# Patient Record
Sex: Female | Born: 2000 | Race: White | Hispanic: No | Marital: Single | Smoking: Never smoker
Health system: Southern US, Community
[De-identification: ages and names within clinical notes are randomized; demographics above are authoritative.]

## PROBLEM LIST (undated history)

## (undated) DIAGNOSIS — F419 Anxiety disorder, unspecified: Secondary | ICD-10-CM

## (undated) DIAGNOSIS — I951 Orthostatic hypotension: Secondary | ICD-10-CM

## (undated) DIAGNOSIS — E282 Polycystic ovarian syndrome: Secondary | ICD-10-CM

## (undated) DIAGNOSIS — M797 Fibromyalgia: Secondary | ICD-10-CM

## (undated) DIAGNOSIS — F32A Depression, unspecified: Secondary | ICD-10-CM

## (undated) DIAGNOSIS — F411 Generalized anxiety disorder: Secondary | ICD-10-CM

## (undated) DIAGNOSIS — F319 Bipolar disorder, unspecified: Secondary | ICD-10-CM

## (undated) DIAGNOSIS — R002 Palpitations: Secondary | ICD-10-CM

## (undated) DIAGNOSIS — G43009 Migraine without aura, not intractable, without status migrainosus: Secondary | ICD-10-CM

## (undated) DIAGNOSIS — H9190 Unspecified hearing loss, unspecified ear: Secondary | ICD-10-CM

## (undated) DIAGNOSIS — R000 Tachycardia, unspecified: Secondary | ICD-10-CM

## (undated) DIAGNOSIS — G44209 Tension-type headache, unspecified, not intractable: Secondary | ICD-10-CM

## (undated) DIAGNOSIS — R569 Unspecified convulsions: Secondary | ICD-10-CM

## (undated) DIAGNOSIS — H5500 Unspecified nystagmus: Secondary | ICD-10-CM

## (undated) DIAGNOSIS — N912 Amenorrhea, unspecified: Secondary | ICD-10-CM

## (undated) DIAGNOSIS — G4700 Insomnia, unspecified: Secondary | ICD-10-CM

## (undated) DIAGNOSIS — F410 Panic disorder [episodic paroxysmal anxiety] without agoraphobia: Secondary | ICD-10-CM

## (undated) DIAGNOSIS — R55 Syncope and collapse: Secondary | ICD-10-CM

## (undated) DIAGNOSIS — R51 Headache: Secondary | ICD-10-CM

## (undated) DIAGNOSIS — F329 Major depressive disorder, single episode, unspecified: Secondary | ICD-10-CM

## (undated) HISTORY — DX: Unspecified nystagmus: H55.00

## (undated) HISTORY — DX: Tension-type headache, unspecified, not intractable: G44.209

## (undated) HISTORY — DX: Syncope and collapse: R55

## (undated) HISTORY — DX: Migraine without aura, not intractable, without status migrainosus: G43.009

## (undated) HISTORY — DX: Insomnia, unspecified: G47.00

## (undated) HISTORY — DX: Palpitations: R00.2

## (undated) HISTORY — DX: Amenorrhea, unspecified: N91.2

## (undated) HISTORY — DX: Tachycardia, unspecified: R00.0

## (undated) HISTORY — DX: Generalized anxiety disorder: F41.1

## (undated) HISTORY — DX: Orthostatic hypotension: I95.1

## (undated) HISTORY — DX: Headache: R51

## (undated) HISTORY — DX: Unspecified convulsions: R56.9

## (undated) HISTORY — DX: Panic disorder (episodic paroxysmal anxiety): F41.0

## (undated) HISTORY — PX: NO PAST SURGERIES: SHX2092

---
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
## 2018-01-04 ENCOUNTER — Other Ambulatory Visit: Payer: Self-pay

## 2018-01-04 ENCOUNTER — Encounter: Payer: Self-pay | Admitting: Emergency Medicine

## 2018-01-04 ENCOUNTER — Emergency Department
Admission: EM | Admit: 2018-01-04 | Discharge: 2018-01-04 | Disposition: A | Discharge status: Home / Self Care | Payer: Medicaid Other | Attending: Emergency Medicine | Admitting: Emergency Medicine

## 2018-01-04 DIAGNOSIS — Z79899 Other long term (current) drug therapy: Secondary | ICD-10-CM | POA: Insufficient documentation

## 2018-01-04 DIAGNOSIS — J111 Influenza due to unidentified influenza virus with other respiratory manifestations: Secondary | ICD-10-CM | POA: Insufficient documentation

## 2018-01-04 DIAGNOSIS — R69 Illness, unspecified: Secondary | ICD-10-CM

## 2018-01-04 MED ORDER — HYDROCOD POLST-CPM POLST ER 10-8 MG/5ML PO SUER
5.0000 mL | Freq: Once | ORAL | Status: AC
  Administered 2018-01-04: 5 mL via ORAL
  Filled 2018-01-04: qty 5

## 2018-01-04 MED ORDER — IBUPROFEN 600 MG PO TABS
600.0000 mg | ORAL_TABLET | Freq: Once | ORAL | Status: AC
  Administered 2018-01-04: 600 mg via ORAL
  Filled 2018-01-04: qty 1

## 2018-01-04 MED ORDER — PSEUDOEPH-BROMPHEN-DM 30-2-10 MG/5ML PO SYRP: 10.0000 mL | ORAL_SOLUTION | Freq: Four times a day (QID) | ORAL | 0 refills | Status: DC | PRN

## 2018-01-04 NOTE — ED Triage Notes (Signed)
Patient ambulatory to triage with steady gait, without difficulty or distress noted, mask in place; pt reports body aches, fever cough & congestion today

## 2018-01-04 NOTE — Discharge Instructions (Signed)
Take tylenol or ibuprofen for fever and body aches.  Follow up with your primary care provider for symptoms that are not improving over the week.  Return to the ER for symptoms that change or worsen if unable to schedule an appointment.

## 2018-01-04 NOTE — ED Provider Notes (Signed)
[facility] Emergency Department Provider Note  ____________________________________________  Time seen: Approximately 10:07 PM  I have reviewed the triage vital signs and the nursing notes.   HISTORY  Chief Complaint Cough   HPI Gina Green is a 17 y.o. female who presents to the emergency department for treatment and evaluation of body aches, cough, fever, congestion, sore throat, and ear ache that started today. No alleviating measures attempted prior to arrival. No chronic illness.    Past Medical History:  Diagnosis Date  . Amenorrhea   . Anxiety   . Anxiety state 05/31/2015  . Depression   . Fibromyalgia   . Generalized anxiety disorder 05/15/2014  . Hearing loss   . Insomnia 05/15/2014  . Migraine without aura and without status migrainosus, not intractable 05/15/2014  . Moderate headache 05/31/2015  . Nystagmus   . Palpitations   . Panic attack 05/15/2014  . POTS (postural orthostatic tachycardia syndrome) 10/19/2016  . Pre-syncope   . Seizure-like activity (HCC) 05/31/2015  . Tension headache 05/15/2014    Patient Active Problem List   Diagnosis Date Noted  . Multiple neurological symptoms 02/05/2017  . 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 Cannabis abuse 02/05/2017  . POTS (postural orthostatic tachycardia syndrome) 10/19/2016  . Palpitations 07/31/2016  . Moderate headache 05/31/2015  . Seizure-like activity (HCC) 05/31/2015  . Anxiety state 05/31/2015  . Tension headache 05/15/2014  . Generalized anxiety disorder 05/15/2014  . Panic attack 05/15/2014  . Insomnia 05/15/2014  . Migraine without aura and without status migrainosus, not intractable 05/15/2014    Past Surgical History:  Procedure Laterality Date  . NO PAST SURGERIES      Prior to Admission medications   Medication Sig Start Date End Date Taking? Authorizing Provider  ALPRAZolam (XANAX) 0.25 MG tablet Take 0.25 mg by mouth 2 (two) times daily.    [provider]   amoxicillin (AMOXIL) 875 MG tablet Take 1 tablet (875 mg total) by mouth 2 (two) times daily. 03/25/17   Sherrie MustacheFisher, Roselyn BeringSusan W, PA-C  brompheniramine-pseudoephedrine-DM 30-2-10 MG/5ML syrup Take 10 mLs by mouth 4 (four) times daily as needed. 01/04/18   Ramey Ketcherside B, FNP  busPIRone (BUSPAR) 7.5 MG tablet Take 7.5 mg by mouth 2 (two) times daily.     [provider]  DULoxetine (CYMBALTA) 30 MG capsule Take 30 mg by mouth daily.    [provider]  topiramate (TOPAMAX) 25 MG tablet Take 1 tablet (25 mg total) by mouth 2 (two) times daily. 02/05/17   Anson FretAhern, Antonia B, MD  traZODone (DESYREL) 50 MG tablet Take 50 mg by mouth daily.     [provider]    Allergies Hpv 9-valent recomb vaccine and Other  Family History  Problem Relation Age of Onset  . High Cholesterol Mother   . Thyroid disease Mother   . Depression Mother   . Anxiety disorder Mother   . Migraines Mother   . Anxiety disorder Father   . High Cholesterol Maternal Grandmother   . Thyroid disease Maternal Grandmother   . Lung cancer Maternal Grandmother   . Cancer Maternal Grandfather        MOUTH  . Cirrhosis Maternal Grandfather   . Cancer Paternal Grandmother        SKIN  . Heart disease Other        maternal great grandparents, both had MI & strokes    Social History Social History   Tobacco Use  . Smoking status: Never Smoker  . Smokeless  tobacco: Never Used  Substance Use Topics  . Alcohol use: No    Alcohol/week: 0.0 standard drinks  . Drug use: Yes    Types: Marijuana    Comment: pt denies even past use of drugs    Review of Systems Constitutional: Positive for fever/chills. Normal appetite. ENT: Positive for sore throat. Cardiovascular: Denies chest pain. Respiratory: Negative for shortness of breath. Positive for cough. Negative for wheezing.  Gastrointestinal: Negative for nausea,  no vomiting.  no diarrhea.  Musculoskeletal: Positive for body aches Skin: Negative for  rash. Neurological: positive for headaches ____________________________________________   PHYSICAL EXAM:  VITAL SIGNS: ED Triage Vitals [01/04/18 2121]  Enc Vitals Group     BP 124/67     Pulse Rate 89     Resp 18     Temp 98.9 F (37.2 C)     Temp Source Oral     SpO2 99 %     Weight 135 lb (61.2 kg)     Height 5\' 2"  (1.575 m)     Head Circumference      Peak Flow      Pain Score 6     Pain Loc      Pain Edu?      Excl. in GC?     Constitutional: Alert and oriented. Acutely ill appearing and in no acute distress. Eyes: Conjunctivae are normal. Ears: Bilateral TM injected and erythematous Nose: maxillary sinus congestion noted; no rhinnorhea. Mouth/Throat: Mucous membranes are moist.  Oropharynx erythematous. Tonsils not visualized. Uvula midline. Neck: No stridor.  Lymphatic: Palpable, tender anterior cervical lymphadenopathy. Cardiovascular: Normal rate, regular rhythm. Good peripheral circulation. Respiratory: Respirations are even and unlabored.  No retractions. Breath sounds clear to auscultation. Gastrointestinal: Soft and nontender.  Musculoskeletal: FROM x 4 extremities.  Neurologic:  Normal speech and language. Skin:  Skin is warm, dry and intact. No rash noted. Psychiatric: Mood and affect are normal. Speech and behavior are normal.  ____________________________________________   LABS (all labs ordered are listed, but only abnormal results are displayed)  Labs Reviewed - No data to display ____________________________________________  EKG  Not indicated. ____________________________________________  RADIOLOGY  Not indicated. ____________________________________________   PROCEDURES  Procedure(s) performed: None  Critical Care performed: No ____________________________________________   INITIAL IMPRESSION / ASSESSMENT AND PLAN / ED COURSE  17 y.o. female who presents to the emergency department for treatment and evaluation of symptoms  and exam consistent with influenza. She will be treated symptomatically with Bromfed DM, tylenol and ibuprofen. She was advised to follow up with her PCP for symptoms that are not improving over the next few days. She is to return to the ER for symptoms that change or worsen if unable to schedule an appointment.    Medications  chlorpheniramine-HYDROcodone (TUSSIONEX) 10-8 MG/5ML suspension 5 mL (5 mLs Oral Given 01/04/18 2210)  ibuprofen (ADVIL,MOTRIN) tablet 600 mg (600 mg Oral Given 01/04/18 2210)    ED Discharge Orders         Ordered    brompheniramine-pseudoephedrine-DM 30-2-10 MG/5ML syrup  4 times daily PRN     01/04/18 2203           Pertinent labs & imaging results that were available during my care of the patient were reviewed by me and considered in my medical decision making (see chart for details).    If controlled substance prescribed during this visit, 12 month history viewed on the [facility] prior to issuing an initial prescription for Schedule II or III opiod. ____________________________________________  FINAL CLINICAL IMPRESSION(S) / ED DIAGNOSES  Final diagnoses:  Influenza-like illness    Note:  This document was prepared using Dragon voice recognition software and may include unintentional dictation errors.     Chinita Pester, FNP 01/04/18 2213    Minna Antis, MD 01/04/18 2356

## 2018-07-07 ENCOUNTER — Encounter: Payer: Self-pay | Admitting: Emergency Medicine

## 2018-07-07 ENCOUNTER — Emergency Department: Payer: Self-pay

## 2018-07-07 ENCOUNTER — Emergency Department
Admission: EM | Admit: 2018-07-07 | Discharge: 2018-07-07 | Disposition: A | Discharge status: Home / Self Care | Payer: Self-pay | Attending: Emergency Medicine | Admitting: Emergency Medicine

## 2018-07-07 ENCOUNTER — Other Ambulatory Visit: Payer: Self-pay

## 2018-07-07 DIAGNOSIS — F1210 Cannabis abuse, uncomplicated: Secondary | ICD-10-CM | POA: Insufficient documentation

## 2018-07-07 DIAGNOSIS — N939 Abnormal uterine and vaginal bleeding, unspecified: Secondary | ICD-10-CM

## 2018-07-07 DIAGNOSIS — R102 Pelvic and perineal pain: Secondary | ICD-10-CM | POA: Insufficient documentation

## 2018-07-07 DIAGNOSIS — R109 Unspecified abdominal pain: Secondary | ICD-10-CM

## 2018-07-07 DIAGNOSIS — Z79899 Other long term (current) drug therapy: Secondary | ICD-10-CM | POA: Insufficient documentation

## 2018-07-07 LAB — URINALYSIS, COMPLETE (UACMP) WITH MICROSCOPIC
Bacteria, UA: NONE SEEN
Bilirubin Urine: NEGATIVE
Glucose, UA: NEGATIVE mg/dL
Ketones, ur: 20 mg/dL — AB
Leukocytes,Ua: NEGATIVE
Nitrite: NEGATIVE
Protein, ur: NEGATIVE mg/dL
Specific Gravity, Urine: 1.021 (ref 1.005–1.030)
pH: 6 (ref 5.0–8.0)

## 2018-07-07 LAB — COMPREHENSIVE METABOLIC PANEL
ALT: 10 U/L (ref 0–44)
AST: 17 U/L (ref 15–41)
Albumin: 5.1 g/dL — ABNORMAL HIGH (ref 3.5–5.0)
Alkaline Phosphatase: 46 U/L (ref 38–126)
Anion gap: 6 (ref 5–15)
BUN: 12 mg/dL (ref 6–20)
CO2: 26 mmol/L (ref 22–32)
Calcium: 9.5 mg/dL (ref 8.9–10.3)
Chloride: 103 mmol/L (ref 98–111)
Creatinine, Ser: 0.72 mg/dL (ref 0.44–1.00)
GFR calc Af Amer: >60 mL/min (ref >60)
GFR calc non Af Amer: >60 mL/min (ref >60)
Glucose, Bld: 97 mg/dL (ref 70–99)
Potassium: 3.7 mmol/L (ref 3.5–5.1)
Sodium: 135 mmol/L (ref 135–145)
Total Bilirubin: 1.3 mg/dL — ABNORMAL HIGH (ref 0.3–1.2)
Total Protein: 7.7 g/dL (ref 6.5–8.1)

## 2018-07-07 LAB — CBC
HCT: 40.6 % (ref 36.0–46.0)
Hemoglobin: 14.2 g/dL (ref 12.0–15.0)
MCH: 31.5 pg (ref 26.0–34.0)
MCHC: 35 g/dL (ref 30.0–36.0)
MCV: 90 fL (ref 80.0–100.0)
Platelets: 240 10*3/uL (ref 150–400)
RBC: 4.51 MIL/uL (ref 3.87–5.11)
RDW: 11.8 % (ref 11.5–15.5)
WBC: 4.7 10*3/uL (ref 4.0–10.5)
nRBC: 0 % (ref 0.0–0.2)

## 2018-07-07 LAB — HCG, QUANTITATIVE, PREGNANCY: hCG, Beta Chain, Quant, S: <1 m[IU]/mL (ref <5)

## 2018-07-07 NOTE — ED Provider Notes (Signed)
[facility] Emergency Department Provider Note  ____________________________________________  Time seen: Approximately 6:50 PM  I have reviewed the triage vital signs and the nursing notes.   HISTORY  Chief Complaint Vaginal Bleeding    HPI Gina Green is a 18 y.o. female who presents to the emergency department for evaluation  of vaginal bleeding.  She had a positive pregnancy test in April but then had negative pregnancy test at home.  She thinks that she may have miscarried.  She has had intermittent vaginal bleeding for the last month with some mid to lower pelvic pain.  Patient is concerned that she may need a D&C.  Past Medical History:  Diagnosis Date  . Amenorrhea   . Anxiety   . Anxiety state 05/31/2015  . Depression   . Fibromyalgia   . Generalized anxiety disorder 05/15/2014  . Hearing loss   . Insomnia 05/15/2014  . Migraine without aura and without status migrainosus, not intractable 05/15/2014  . Moderate headache 05/31/2015  . Nystagmus   . Palpitations   . Panic attack 05/15/2014  . POTS (postural orthostatic tachycardia syndrome) 10/19/2016  . Pre-syncope   . Seizure-like activity (HCC) 05/31/2015  . Tension headache 05/15/2014    Patient Active Problem List   Diagnosis Date Noted  . Multiple neurological symptoms 02/05/2017  . 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 Cannabis abuse 02/05/2017  . POTS (postural orthostatic tachycardia syndrome) 10/19/2016  . Palpitations 07/31/2016  . Moderate headache 05/31/2015  . Seizure-like activity (HCC) 05/31/2015  . Anxiety state 05/31/2015  . Tension headache 05/15/2014  . Generalized anxiety disorder 05/15/2014  . Panic attack 05/15/2014  . Insomnia 05/15/2014  . Migraine without aura and without status migrainosus, not intractable 05/15/2014    Past Surgical History:  Procedure Laterality Date  . NO PAST SURGERIES      Prior to Admission medications   Medication Sig Start Date End Date  Taking? Authorizing Provider  ALPRAZolam (XANAX) 0.25 MG tablet Take 0.25 mg by mouth 2 (two) times daily.    [provider]  amoxicillin (AMOXIL) 875 MG tablet Take 1 tablet (875 mg total) by mouth 2 (two) times daily. 03/25/17   Sherrie MustacheFisher, Roselyn BeringSusan W, PA-C  brompheniramine-pseudoephedrine-DM 30-2-10 MG/5ML syrup Take 10 mLs by mouth 4 (four) times daily as needed. 01/04/18   Florette Thai B, FNP  busPIRone (BUSPAR) 7.5 MG tablet Take 7.5 mg by mouth 2 (two) times daily.     [provider]  DULoxetine (CYMBALTA) 30 MG capsule Take 30 mg by mouth daily.    [provider]  topiramate (TOPAMAX) 25 MG tablet Take 1 tablet (25 mg total) by mouth 2 (two) times daily. 02/05/17   Anson FretAhern, Antonia B, MD  traZODone (DESYREL) 50 MG tablet Take 50 mg by mouth daily.     [provider]    Allergies Hpv 9-valent recomb vaccine and Other  Family History  Problem Relation Age of Onset  . High Cholesterol Mother   . Thyroid disease Mother   . Depression Mother   . Anxiety disorder Mother   . Migraines Mother   . Anxiety disorder Father   . High Cholesterol Maternal Grandmother   . Thyroid disease Maternal Grandmother   . Lung cancer Maternal Grandmother   . Cancer Maternal Grandfather        MOUTH  . Cirrhosis Maternal Grandfather   . Cancer Paternal Grandmother        SKIN  . Heart disease Other  maternal great grandparents, both had MI & strokes    Social History Social History   Tobacco Use  . Smoking status: Never Smoker  . Smokeless tobacco: Never Used  Substance Use Topics  . Alcohol use: No    Alcohol/week: 0.0 standard drinks  . Drug use: Yes    Types: Marijuana    Comment: pt denies even past use of drugs    Review of Systems Constitutional: Negative for fever. Respiratory: Negative for shortness of breath or cough. Gastrointestinal: Negative for abdominal pain; negative for nausea , negative for vomiting. Genitourinary: Negative for  dysuria , negative for vaginal discharge.  Positive for vaginal bleeding Musculoskeletal: Negative for back pain. Skin: Negative for acute skin changes/rash/lesion. ____________________________________________   PHYSICAL EXAM:  VITAL SIGNS: ED Triage Vitals  Enc Vitals Group     BP 07/07/18 1735 123/83     Pulse Rate 07/07/18 1735 93     Resp 07/07/18 1735 16     Temp 07/07/18 1735 98.8 F (37.1 C)     Temp Source 07/07/18 1735 Oral     SpO2 07/07/18 1735 98 %     Weight 07/07/18 1736 137 lb (62.1 kg)     Height 07/07/18 1736 5\' 2"  (1.575 m)     Head Circumference --      Peak Flow --      Pain Score 07/07/18 1735 0     Pain Loc --      Pain Edu? --      Excl. in GC? --     Constitutional: Alert and oriented. Well appearing and in no acute distress. Eyes: Conjunctivae are normal. Head: Atraumatic. Nose: No congestion/rhinnorhea. Mouth/Throat: Mucous membranes are moist. Respiratory: Normal respiratory effort.  No retractions. Gastrointestinal: Bowel sounds active x 4; Abdomen is soft without rebound or guarding. Genitourinary: Pelvic exam: Not indicated Musculoskeletal: No extremity tenderness nor edema.  Neurologic:  Normal speech and language. No gross focal neurologic deficits are appreciated. Speech is normal. No gait instability. Skin:  Skin is warm, dry and intact. No rash noted on exposed skin. Psychiatric: Mood and affect are normal. Speech and behavior are normal.  ____________________________________________   LABS (all labs ordered are listed, but only abnormal results are displayed)  Labs Reviewed  COMPREHENSIVE METABOLIC PANEL - Abnormal; Notable for the following components:      Result Value   Albumin 5.1 (*)    Total Bilirubin 1.3 (*)    All other components within normal limits  URINALYSIS, COMPLETE (UACMP) WITH MICROSCOPIC - Abnormal; Notable for the following components:   Color, Urine YELLOW (*)    APPearance CLEAR (*)    Hgb urine dipstick  MODERATE (*)    Ketones, ur 20 (*)    All other components within normal limits  CBC  HCG, QUANTITATIVE, PREGNANCY  POC URINE PREG, ED   ____________________________________________  RADIOLOGY  Transvaginal ultrasound shows a normal pelvis with the exception of numerous ovarian follicles.  Polycystic ovarian syndrome is considered. ____________________________________________  Procedures  ____________________________________________  18 year old female presenting to the emergency department for evaluation after having a positive pregnancy test in April.  Since that time, she has bled intermittently.  She states she has passed a few clots each day that she bleeds.  She has also experienced some lower pelvic cramping/pain.  She currently denies having pain.   Today, labs are reassuring.  Patient is not anemic.  Ultrasound is also reassuring.  No emergent findings.  Patient will be discharged home to  follow-up with gynecology for potential polycystic ovarian syndrome.  She will be advised to return to the emergency department for vaginal bleeding that requires her to change pad or tampon every hour.  INITIAL IMPRESSION / ASSESSMENT AND PLAN / ED COURSE  Pertinent labs & imaging results that were available during my care of the patient were reviewed by me and considered in my medical decision making (see chart for details).  ____________________________________________   FINAL CLINICAL IMPRESSION(S) / ED DIAGNOSES  Final diagnoses:  Abdominal pain  Abnormal vaginal bleeding    Note:  This document was prepared using Dragon voice recognition software and may include unintentional dictation errors.   Victorino Dike, FNP 07/07/18 2038    Schuyler Amor, MD 07/07/18 [id number]

## 2018-07-07 NOTE — ED Triage Notes (Signed)
Reports irregular periods at baseline.  Had positive pregnancy test in April followed by bunch of negatives. She thinks may have miscarried.  Pt reports vaginal bleeding for last month on and off.  Some mid lower pelvic pain.  Bleeding is not heavy.  Periods have been irregular since stopping depo 4 years ago.

## 2018-07-07 NOTE — Discharge Instructions (Signed)
Please call and schedule follow-up appointment with gynecology.  Return to the emergency department if bleeding becomes heavy enough to where you have to change her pad or your tampon every hour.

## 2018-07-11 ENCOUNTER — Telehealth: Payer: Self-pay

## 2018-07-11 NOTE — Telephone Encounter (Signed)
Pt called no answer LM via voicemail to call the office for prescreening. 

## 2018-07-12 ENCOUNTER — Encounter: Payer: Self-pay | Admitting: Obstetrics and Gynecology

## 2018-07-12 ENCOUNTER — Other Ambulatory Visit: Payer: Self-pay

## 2018-07-12 ENCOUNTER — Ambulatory Visit: Payer: Self-pay | Admitting: Obstetrics and Gynecology

## 2018-07-12 VITALS — BP 114/83 | HR 83 | Ht 62.0 in | Wt 136.5 lb

## 2018-07-12 DIAGNOSIS — E282 Polycystic ovarian syndrome: Secondary | ICD-10-CM

## 2018-07-12 DIAGNOSIS — N913 Primary oligomenorrhea: Secondary | ICD-10-CM

## 2018-07-12 DIAGNOSIS — Z3009 Encounter for other general counseling and advice on contraception: Secondary | ICD-10-CM

## 2018-07-12 NOTE — Progress Notes (Signed)
Patient comes in today for new patient today. She had a positive home pregnancy test 05/07/2018. She started bleeding a week later started bleeding for 5 days. She stopped then started bleeding again and it lasted 12 days. She then stopped again for 14 days. Then started bleeding again and has been bleeding since then. It has not been heavy. Went to ED 07/07/2018 and BETA was done that showed she was not pregnant.

## 2018-07-12 NOTE — Progress Notes (Signed)
HPI:      [name] is a 18 y.o. No obstetric history on file. who LMP was No LMP recorded. (Menstrual status: Irregular Periods).  Subjective:   She presents today patient presents today to discuss her irregular menstrual periods.  She says she has had irregular cycles her entire life.  She has previously been on Depo-Provera but was unhappy with it.  She states that she sometimes only has 1 or 2 menses per year. She recently went to the emergency department for irregular bleeding and was concerned that she had had a miscarriage 1 month before.  Her uterus is found to be empty by ultrasound but the ovaries appear to have multiple follicular cysts leading to the possible diagnosis of PCO. Patient is not interested in current fertility but is desirous of knowing if she has a diagnosis of PCO and would like to begin birth control appropriate for use with PCO.    Hx: The following portions of the patient's history were reviewed and updated as appropriate:             She  has a past medical history of Amenorrhea, Anxiety, Anxiety state (05/31/2015), Depression, Fibromyalgia, Generalized anxiety disorder (05/15/2014), Hearing loss, Insomnia (05/15/2014), Migraine without aura and without status migrainosus, not intractable (05/15/2014), Moderate headache (05/31/2015), Nystagmus, Palpitations, Panic attack (05/15/2014), POTS (postural orthostatic tachycardia syndrome) (10/19/2016), Pre-syncope, Seizure-like activity (HCC) (05/31/2015), and Tension headache (05/15/2014). She does not have any pertinent problems on file. She  has a past surgical history that includes No past surgeries. Her family history includes Anxiety disorder in her father and mother; Cancer in her maternal grandfather and paternal grandmother; Cirrhosis in her maternal grandfather; Depression in her mother; Heart disease in an other family member; High Cholesterol in her maternal grandmother and mother; Lung cancer in her maternal  grandmother; Migraines in her mother; Thyroid disease in her maternal grandmother and mother. She  reports that she has never smoked. She has never used smokeless tobacco. She reports current drug use. Drug: Marijuana. She reports that she does not drink alcohol. She has a current medication list which includes the following prescription(s): alprazolam, desvenlafaxine succinate er, quetiapine, buspirone, duloxetine, topiramate, and trazodone. She is allergic to hpv 9-valent recomb vaccine and other.       Review of Systems:  Review of Systems  Constitutional: Denied constitutional symptoms, night sweats, recent illness, fatigue, fever, insomnia and weight loss.  Eyes: Denied eye symptoms, eye pain, photophobia, vision change and visual disturbance.  Ears/Nose/Throat/Neck: Denied ear, nose, throat or neck symptoms, hearing loss, nasal discharge, sinus congestion and sore throat.  Cardiovascular: Denied cardiovascular symptoms, arrhythmia, chest pain/pressure, edema, exercise intolerance, orthopnea and palpitations.  Respiratory: Denied pulmonary symptoms, asthma, pleuritic pain, productive sputum, cough, dyspnea and wheezing.  Gastrointestinal: Denied, gastro-esophageal reflux, melena, nausea and vomiting.  Genitourinary: Denied genitourinary symptoms including symptomatic vaginal discharge, pelvic relaxation issues, and urinary complaints.  Musculoskeletal: Denied musculoskeletal symptoms, stiffness, swelling, muscle weakness and myalgia.  Dermatologic: Denied dermatology symptoms, rash and scar.  Neurologic: Denied neurology symptoms, dizziness, headache, neck pain and syncope.  Psychiatric: Denied psychiatric symptoms, anxiety and depression.  Endocrine: Denied endocrine symptoms including hot flashes and night sweats.   Meds:   Current Outpatient Medications on File Prior to Visit  Medication Sig Dispense Refill  . ALPRAZolam (XANAX) 0.25 MG tablet Take 0.25 mg by mouth 2 (two) times  daily.    Marland Kitchen. Desvenlafaxine Succinate ER (PRISTIQ) 25 MG TB24 Take by mouth.    .Marland Kitchen  QUEtiapine (SEROQUEL) 100 MG tablet Take 100 mg by mouth at bedtime.    . busPIRone (BUSPAR) 7.5 MG tablet Take 7.5 mg by mouth 2 (two) times daily.     . DULoxetine (CYMBALTA) 30 MG capsule Take 30 mg by mouth daily.    Marland Kitchen topiramate (TOPAMAX) 25 MG tablet Take 1 tablet (25 mg total) by mouth 2 (two) times daily. (Patient not taking: Reported on 07/12/2018) 180 tablet 6  . traZODone (DESYREL) 50 MG tablet Take 50 mg by mouth daily.      No current facility-administered medications on file prior to visit.     Objective:     Vitals:   07/12/18 1542  BP: 114/83  Pulse: 83                Assessment:    No obstetric history on file. Patient Active Problem List   Diagnosis Date Noted  . Multiple neurological symptoms 02/05/2017  . 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 Cannabis abuse 02/05/2017  . POTS (postural orthostatic tachycardia syndrome) 10/19/2016  . Palpitations 07/31/2016  . Moderate headache 05/31/2015  . Seizure-like activity ([place]) 05/31/2015  . Anxiety state 05/31/2015  . Tension headache 05/15/2014  . Generalized anxiety disorder 05/15/2014  . Panic attack 05/15/2014  . Insomnia 05/15/2014  . Migraine without aura and without status migrainosus, not intractable 05/15/2014     1. PCO (polycystic ovaries)   2. Primary oligomenorrhea   3. Birth control counseling     With irregular cycles and multiple follicular cyst noted on ultrasound it is likely that she has PCO.   Plan:            1.  PCO labs  2.  Once PCO labs return will consider forms of birth control especially OCPs/NuvaRing/IUD.  Orders Orders Placed This Encounter  Procedures  . DHEA-sulfate  . FSH/LH  . Glucose, fasting  . Insulin, random  . Prolactin  . Testosterone, Free, Total, SHBG  . TSH    No orders of the defined types were placed in this encounter.     F/U  No follow-ups on file. I spent 33 minutes  involved in the care of this patient of which greater than 50% was spent discussing her possible miscarriage, PCO and how the diagnosis is made and what the various treatments are.  The different types of PCO was discussed in detail.  Multiple birth control methods were reviewed discussing each risk and benefits and how they would enter mesh with the treatment of PCO.  Future pregnancy and PCO discussed in detail.  All questions answered.  Finis Bud, M.D. 07/12/2018 4:22 PM

## 2018-07-13 LAB — INSULIN, RANDOM: INSULIN: 11.2 u[IU]/mL (ref 2.6–24.9)

## 2018-07-13 LAB — TESTOSTERONE, FREE, TOTAL, SHBG
Sex Hormone Binding: 74.2 nmol/L (ref 24.6–122.0)
Testosterone, Free: 2.4 pg/mL (ref 0.0–4.2)
Testosterone: 52 ng/dL — ABNORMAL HIGH (ref 8–48)

## 2018-07-13 LAB — PROLACTIN: Prolactin: 8.2 ng/mL (ref 4.8–23.3)

## 2018-07-13 LAB — DHEA-SULFATE: DHEA-SO4: 234.7 ug/dL (ref 110.0–431.7)

## 2018-07-13 LAB — GLUCOSE, FASTING: Glucose, Plasma: 84 mg/dL (ref 65–99)

## 2018-07-13 LAB — TSH: TSH: 1.32 u[IU]/mL (ref 0.450–4.500)

## 2018-07-13 LAB — FSH/LH
FSH: 8 m[IU]/mL
LH: 25.9 m[IU]/mL

## 2018-08-05 ENCOUNTER — Emergency Department
Admission: EM | Admit: 2018-08-05 | Discharge: 2018-08-05 | Disposition: A | Discharge status: Home / Self Care | Payer: Self-pay | Attending: Emergency Medicine | Admitting: Emergency Medicine

## 2018-08-05 ENCOUNTER — Emergency Department: Payer: Self-pay

## 2018-08-05 ENCOUNTER — Other Ambulatory Visit: Payer: Self-pay

## 2018-08-05 DIAGNOSIS — R102 Pelvic and perineal pain: Secondary | ICD-10-CM | POA: Insufficient documentation

## 2018-08-05 DIAGNOSIS — F1210 Cannabis abuse, uncomplicated: Secondary | ICD-10-CM | POA: Insufficient documentation

## 2018-08-05 DIAGNOSIS — Z79899 Other long term (current) drug therapy: Secondary | ICD-10-CM | POA: Insufficient documentation

## 2018-08-05 LAB — COMPREHENSIVE METABOLIC PANEL
ALT: 9 U/L (ref 0–44)
AST: 16 U/L (ref 15–41)
Albumin: 4.8 g/dL (ref 3.5–5.0)
Alkaline Phosphatase: 48 U/L (ref 38–126)
Anion gap: 9 (ref 5–15)
BUN: 8 mg/dL (ref 6–20)
CO2: 23 mmol/L (ref 22–32)
Calcium: 9.4 mg/dL (ref 8.9–10.3)
Chloride: 107 mmol/L (ref 98–111)
Creatinine, Ser: 0.62 mg/dL (ref 0.44–1.00)
GFR calc Af Amer: >60 mL/min (ref >60)
GFR calc non Af Amer: >60 mL/min (ref >60)
Glucose, Bld: 106 mg/dL — ABNORMAL HIGH (ref 70–99)
Potassium: 3.6 mmol/L (ref 3.5–5.1)
Sodium: 139 mmol/L (ref 135–145)
Total Bilirubin: 0.8 mg/dL (ref 0.3–1.2)
Total Protein: 7.7 g/dL (ref 6.5–8.1)

## 2018-08-05 LAB — URINALYSIS, COMPLETE (UACMP) WITH MICROSCOPIC
Bacteria, UA: NONE SEEN
Bilirubin Urine: NEGATIVE
Glucose, UA: NEGATIVE mg/dL
Hgb urine dipstick: NEGATIVE
Ketones, ur: NEGATIVE mg/dL
Leukocytes,Ua: NEGATIVE
Nitrite: NEGATIVE
Protein, ur: NEGATIVE mg/dL
Specific Gravity, Urine: 1.006 (ref 1.005–1.030)
pH: 7 (ref 5.0–8.0)

## 2018-08-05 LAB — CBC
HCT: 40.7 % (ref 36.0–46.0)
Hemoglobin: 14.5 g/dL (ref 12.0–15.0)
MCH: 31.4 pg (ref 26.0–34.0)
MCHC: 35.6 g/dL (ref 30.0–36.0)
MCV: 88.1 fL (ref 80.0–100.0)
Platelets: 275 10*3/uL (ref 150–400)
RBC: 4.62 MIL/uL (ref 3.87–5.11)
RDW: 11.4 % — ABNORMAL LOW (ref 11.5–15.5)
WBC: 7.6 10*3/uL (ref 4.0–10.5)
nRBC: 0 % (ref 0.0–0.2)

## 2018-08-05 LAB — POCT PREGNANCY, URINE: Preg Test, Ur: NEGATIVE

## 2018-08-05 LAB — LIPASE, BLOOD: Lipase: 31 U/L (ref 11–51)

## 2018-08-05 MED ORDER — KETOROLAC TROMETHAMINE 30 MG/ML IJ SOLN
15.0000 mg | Freq: Once | INTRAMUSCULAR | Status: AC
  Administered 2018-08-05: 15 mg via INTRAVENOUS

## 2018-08-05 MED ORDER — OXYCODONE-ACETAMINOPHEN 5-325 MG PO TABS: 1.0000 | ORAL_TABLET | ORAL | 0 refills | Status: DC | PRN

## 2018-08-05 MED ORDER — KETOROLAC TROMETHAMINE 30 MG/ML IJ SOLN
INTRAMUSCULAR | Status: AC
  Administered 2018-08-05: 15 mg via INTRAVENOUS
  Filled 2018-08-05: qty 1

## 2018-08-05 NOTE — ED Provider Notes (Signed)
[facility] Emergency Department Provider Note _______________   First MD Initiated Contact with Patient 08/05/18 0411     (approximate)  I have reviewed the triage vital signs and the nursing notes.   HISTORY  Chief Complaint Abdominal Pain    HPI Gina Green is a 18 y.o. female with below list of previous medical conditions including concern for possible PCOS fibromyalgia migraine and amenorrhea presents to the emergency department secondary to left pelvic discomfort.  Patient denies any dysuria no increased frequency urgency.  Patient denies any fever.  Patient denies any flank or back pain.        Past Medical History:  Diagnosis Date  . Amenorrhea   . Anxiety   . Anxiety state 05/31/2015  . Depression   . Fibromyalgia   . Generalized anxiety disorder 05/15/2014  . Hearing loss   . Insomnia 05/15/2014  . Migraine without aura and without status migrainosus, not intractable 05/15/2014  . Moderate headache 05/31/2015  . Nystagmus   . Palpitations   . Panic attack 05/15/2014  . POTS (postural orthostatic tachycardia syndrome) 10/19/2016  . Pre-syncope   . Seizure-like activity (HCC) 05/31/2015  . Tension headache 05/15/2014    Patient Active Problem List   Diagnosis Date Noted  . Multiple neurological symptoms 02/05/2017  . 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 Cannabis abuse 02/05/2017  . POTS (postural orthostatic tachycardia syndrome) 10/19/2016  . Palpitations 07/31/2016  . Moderate headache 05/31/2015  . Seizure-like activity (HCC) 05/31/2015  . Anxiety state 05/31/2015  . Tension headache 05/15/2014  . Generalized anxiety disorder 05/15/2014  . Panic attack 05/15/2014  . Insomnia 05/15/2014  . Migraine without aura and without status migrainosus, not intractable 05/15/2014    Past Surgical History:  Procedure Laterality Date  . NO PAST SURGERIES      Prior to Admission medications   Medication Sig Start Date End Date Taking?  Authorizing Provider  ALPRAZolam (XANAX) 0.25 MG tablet Take 0.25 mg by mouth 2 (two) times daily.    [provider]  busPIRone (BUSPAR) 7.5 MG tablet Take 7.5 mg by mouth 2 (two) times daily.     [provider]  Desvenlafaxine Succinate ER (PRISTIQ) 25 MG TB24 Take by mouth.    [provider]  DULoxetine (CYMBALTA) 30 MG capsule Take 30 mg by mouth daily.    [provider]  oxyCODONE-acetaminophen (PERCOCET) 5-325 MG tablet Take 1 tablet by mouth every 4 (four) hours as needed. 08/05/18 08/05/19  Darci CurrentBrown,  N, MD  QUEtiapine (SEROQUEL) 100 MG tablet Take 100 mg by mouth at bedtime.    [provider]  topiramate (TOPAMAX) 25 MG tablet Take 1 tablet (25 mg total) by mouth 2 (two) times daily. Patient not taking: Reported on 07/12/2018 02/05/17   Anson FretAhern, Antonia B, MD  traZODone (DESYREL) 50 MG tablet Take 50 mg by mouth daily.     [provider]    Allergies Hpv 9-valent recomb vaccine and Other  Family History  Problem Relation Age of Onset  . High Cholesterol Mother   . Thyroid disease Mother   . Depression Mother   . Anxiety disorder Mother   . Migraines Mother   . Anxiety disorder Father   . High Cholesterol Maternal Grandmother   . Thyroid disease Maternal Grandmother   . Lung cancer Maternal Grandmother   . Cancer Maternal Grandfather        MOUTH  . Cirrhosis Maternal Grandfather   . Cancer Paternal Grandmother  SKIN  . Heart disease Other        maternal great grandparents, both had MI & strokes    Social History Social History   Tobacco Use  . Smoking status: Never Smoker  . Smokeless tobacco: Never Used  Substance Use Topics  . Alcohol use: No    Alcohol/week: 0.0 standard drinks  . Drug use: Yes    Types: Marijuana    Comment: pt denies even past use of drugs    Review of Systems Constitutional: No fever/chills Eyes: No visual changes. ENT: No sore throat. Cardiovascular: Denies chest  pain. Respiratory: Denies shortness of breath. Gastrointestinal: No abdominal pain.  No nausea, no vomiting.  No diarrhea.  No constipation. Genitourinary: Negative for dysuria.  Positive for left pelvic pain Musculoskeletal: Negative for neck pain.  Negative for back pain. Integumentary: Negative for rash. Neurological: Negative for headaches, focal weakness or numbness.  ____________________________________________   PHYSICAL EXAM:  VITAL SIGNS: ED Triage Vitals  Enc Vitals Group     BP 08/05/18 0248 124/73     Pulse Rate 08/05/18 0248 97     Resp 08/05/18 0248 18     Temp 08/05/18 0248 98.2 F (36.8 C)     Temp src --      SpO2 08/05/18 0248 97 %     Weight 08/05/18 0243 61.2 kg (135 lb)     Height 08/05/18 0243 1.575 m (5\' 2" )     Head Circumference --      Peak Flow --      Pain Score 08/05/18 0409 7     Pain Loc --      Pain Edu? --      Excl. in GC? --     Constitutional: Alert and oriented. Well appearing and in no acute distress. Eyes: Conjunctivae are normal.  Mouth/Throat: Mucous membranes are moist.  Oropharynx non-erythematous. Neck: No stridor.   Cardiovascular: Normal rate, regular rhythm. Good peripheral circulation. Grossly normal heart sounds. Respiratory: Normal respiratory effort.  No retractions. No audible wheezing. Gastrointestinal: Soft and nontender. No distention.  Musculoskeletal: No lower extremity tenderness nor edema. No gross deformities of extremities. Neurologic:  Normal speech and language. No gross focal neurologic deficits are appreciated.  Skin:  Skin is warm, dry and intact. No rash noted. Psychiatric: Mood and affect are normal. Speech and behavior are normal.  ____________________________________________   LABS (all labs ordered are listed, but only abnormal results are displayed)  Labs Reviewed  COMPREHENSIVE METABOLIC PANEL - Abnormal; Notable for the following components:      Result Value   Glucose, Bld 106 (*)    All  other components within normal limits  CBC - Abnormal; Notable for the following components:   RDW 11.4 (*)    All other components within normal limits  URINALYSIS, COMPLETE (UACMP) WITH MICROSCOPIC - Abnormal; Notable for the following components:   Color, Urine STRAW (*)    APPearance CLEAR (*)    All other components within normal limits  LIPASE, BLOOD  POC URINE PREG, ED  POCT PREGNANCY, URINE    RADIOLOGY I, [place] Liz Pinho, personally viewed and evaluated these images (plain radiographs) as part of my medical decision making, as well as reviewing the written report by the radiologist.  ED MD interpretation: No CT evidence to explain the patient's reason for left pelvic/flank pain  Official radiology report(s): Ct Renal Stone Study  Result Date: 08/05/2018 CLINICAL DATA:  Left flank and left lower quadrant abdominal pain. Vaginal  bleeding. EXAM: CT ABDOMEN AND PELVIS WITHOUT CONTRAST TECHNIQUE: Multidetector CT imaging of the abdomen and pelvis was performed following the standard protocol without IV contrast. COMPARISON:  None. FINDINGS: Lower chest: Normal. Hepatobiliary: No focal liver abnormality is seen. No gallstones, gallbladder wall thickening, or biliary dilatation. Pancreas: Unremarkable. No pancreatic ductal dilatation or surrounding inflammatory changes. Spleen: Normal in size without focal abnormality. Adrenals/Urinary Tract: Adrenal glands are unremarkable. Kidneys are normal, without renal calculi, focal lesion, or hydronephrosis. Bladder is unremarkable. Stomach/Bowel: Stomach is within normal limits. Appendix appears normal. No evidence of bowel wall thickening, distention, or inflammatory changes. Vascular/Lymphatic: No significant vascular findings are present. No enlarged abdominal or pelvic lymph nodes. Reproductive: Uterus and bilateral adnexa are unremarkable. Other: No abdominal wall hernia or abnormality. No abdominopelvic ascites. Musculoskeletal: No acute or  significant osseous findings. IMPRESSION: No CT findings to explain the patient's left lower quadrant pain. Benign-appearing abdomen and pelvis. Electronically Signed   By: Lorriane Shire M.D.   On: 08/05/2018 05:44      Procedures   ____________________________________________   INITIAL IMPRESSION / MDM / ASSESSMENT AND PLAN / ED COURSE  As part of my medical decision making, I reviewed the following data within the electronic MEDICAL RECORD NUMBER  18 year old female presented with above-stated history and physical exam secondary to left flank/pelvic discomfort.  Considered possibility of ureterolithiasis, ovarian cysts.  CT scan of the abdomen pelvis performed revealed no acute pathology or explanation for the patient's left pelvic discomfort.  Patient given Toradol emergency department improvement of pain with recommendation to follow-up with OB/GYN ____________________________________________  FINAL CLINICAL IMPRESSION(S) / ED DIAGNOSES  Final diagnoses:  Pelvic pain     MEDICATIONS GIVEN DURING THIS VISIT:  Medications  ketorolac (TORADOL) 30 MG/ML injection 15 mg (15 mg Intravenous Given 08/05/18 0440)     ED Discharge Orders         Ordered    oxyCODONE-acetaminophen (PERCOCET) 5-325 MG tablet  Every 4 hours PRN     08/05/18 0616          *Please note:  Gina Green was evaluated in Emergency Department on 08/05/2018 for the symptoms described in the history of present illness. She was evaluated in the context of the global COVID-19 pandemic, which necessitated consideration that the patient might be at risk for infection with the SARS-CoV-2 virus that causes COVID-19. Institutional protocols and algorithms that pertain to the evaluation of patients at risk for COVID-19 are in a state of rapid change based on information released by regulatory bodies including the CDC and federal and state organizations. These policies and algorithms were followed during the patient's care  in the ED.  Some ED evaluations and interventions may be delayed as a result of limited staffing during the pandemic.*  Note:  This document was prepared using Dragon voice recognition software and may include unintentional dictation errors.   Gregor Hams, MD 08/06/18 Zettie Cooley

## 2018-08-05 NOTE — ED Triage Notes (Addendum)
Patient c/o left lower quadrant abdominal pain radiating to back beginning this am. Patient also reports vaginal bleeding/spotting.

## 2018-12-02 ENCOUNTER — Ambulatory Visit: Payer: Self-pay | Admitting: Obstetrics and Gynecology

## 2018-12-02 ENCOUNTER — Encounter: Payer: Self-pay | Admitting: Obstetrics and Gynecology

## 2018-12-02 ENCOUNTER — Other Ambulatory Visit: Payer: Self-pay

## 2018-12-02 VITALS — BP 132/84 | HR 98 | Wt 163.0 lb

## 2018-12-02 DIAGNOSIS — E282 Polycystic ovarian syndrome: Secondary | ICD-10-CM

## 2018-12-02 DIAGNOSIS — Z3009 Encounter for other general counseling and advice on contraception: Secondary | ICD-10-CM

## 2018-12-02 DIAGNOSIS — Z30011 Encounter for initial prescription of contraceptive pills: Secondary | ICD-10-CM

## 2018-12-02 MED ORDER — DESOGESTREL-ETHINYL ESTRADIOL 0.15-0.02/0.01 MG (21/5) PO TABS: 1.0000 | ORAL_TABLET | Freq: Every day | ORAL | 1 refills | Status: DC

## 2018-12-02 NOTE — Progress Notes (Signed)
HPI:      [name] is a 18 y.o. No obstetric history on file. who LMP was Patient's last menstrual period was 10/21/2018.  Subjective:   She presents today because she would like to start OCPs.  She has a history of PCO and her cycles have been irregular.  She is seeking cycle regularity and the benefits of OCPs for patients with PCO.    Hx: The following portions of the patient's history were reviewed and updated as appropriate:             She  has a past medical history of Amenorrhea, Anxiety, Anxiety state (05/31/2015), Depression, Fibromyalgia, Generalized anxiety disorder (05/15/2014), Hearing loss, Insomnia (05/15/2014), Migraine without aura and without status migrainosus, not intractable (05/15/2014), Moderate headache (05/31/2015), Nystagmus, Palpitations, Panic attack (05/15/2014), POTS (postural orthostatic tachycardia syndrome) (10/19/2016), Pre-syncope, Seizure-like activity (HCC) (05/31/2015), and Tension headache (05/15/2014). She does not have any pertinent problems on file. She  has a past surgical history that includes No past surgeries. Her family history includes Anxiety disorder in her father and mother; Cancer in her maternal grandfather and paternal grandmother; Cirrhosis in her maternal grandfather; Depression in her mother; Heart disease in an other family member; High Cholesterol in her maternal grandmother and mother; Lung cancer in her maternal grandmother; Migraines in her mother; Thyroid disease in her maternal grandmother and mother. She  reports that she has never smoked. She has never used smokeless tobacco. She reports current drug use. Drug: Marijuana. She reports that she does not drink alcohol. She has a current medication list which includes the following prescription(s): alprazolam, quetiapine, buspirone, desogestrel-ethinyl estradiol, desvenlafaxine succinate er, duloxetine, oxycodone-acetaminophen, topiramate, and trazodone. She is allergic to hpv 9-valent  recomb vaccine and other.       Review of Systems:  Review of Systems  Constitutional: Denied constitutional symptoms, night sweats, recent illness, fatigue, fever, insomnia and weight loss.  Eyes: Denied eye symptoms, eye pain, photophobia, vision change and visual disturbance.  Ears/Nose/Throat/Neck: Denied ear, nose, throat or neck symptoms, hearing loss, nasal discharge, sinus congestion and sore throat.  Cardiovascular: Denied cardiovascular symptoms, arrhythmia, chest pain/pressure, edema, exercise intolerance, orthopnea and palpitations.  Respiratory: Denied pulmonary symptoms, asthma, pleuritic pain, productive sputum, cough, dyspnea and wheezing.  Gastrointestinal: Denied, gastro-esophageal reflux, melena, nausea and vomiting.  Genitourinary: Denied genitourinary symptoms including symptomatic vaginal discharge, pelvic relaxation issues, and urinary complaints.  Musculoskeletal: Denied musculoskeletal symptoms, stiffness, swelling, muscle weakness and myalgia.  Dermatologic: Denied dermatology symptoms, rash and scar.  Neurologic: Denied neurology symptoms, dizziness, headache, neck pain and syncope.  Psychiatric: Denied psychiatric symptoms, anxiety and depression.  Endocrine: Denied endocrine symptoms including hot flashes and night sweats.   Meds:   Current Outpatient Medications on File Prior to Visit  Medication Sig Dispense Refill  . ALPRAZolam (XANAX) 0.25 MG tablet Take 0.25 mg by mouth 2 (two) times daily.    . QUEtiapine (SEROQUEL) 100 MG tablet Take 100 mg by mouth at bedtime.    . busPIRone (BUSPAR) 7.5 MG tablet Take 7.5 mg by mouth 2 (two) times daily.     Marland Kitchen Desvenlafaxine Succinate ER (PRISTIQ) 25 MG TB24 Take by mouth.    . DULoxetine (CYMBALTA) 30 MG capsule Take 30 mg by mouth daily.    Marland Kitchen oxyCODONE-acetaminophen (PERCOCET) 5-325 MG tablet Take 1 tablet by mouth every 4 (four) hours as needed. (Patient not taking: Reported on 12/02/2018) 20 tablet 0  . topiramate  (TOPAMAX) 25 MG tablet Take 1 tablet (25  mg total) by mouth 2 (two) times daily. (Patient not taking: Reported on 12/02/2018) 180 tablet 6  . traZODone (DESYREL) 50 MG tablet Take 50 mg by mouth daily.      No current facility-administered medications on file prior to visit.     Objective:     Vitals:   12/02/18 0958  BP: 132/84  Pulse: 98                Assessment:    No obstetric history on file. Patient Active Problem List   Diagnosis Date Noted  . Multiple neurological symptoms 02/05/2017  . 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 Cannabis abuse 02/05/2017  . POTS (postural orthostatic tachycardia syndrome) 10/19/2016  . Palpitations 07/31/2016  . Moderate headache 05/31/2015  . Seizure-like activity ([place]) 05/31/2015  . Anxiety state 05/31/2015  . Tension headache 05/15/2014  . Generalized anxiety disorder 05/15/2014  . Panic attack 05/15/2014  . Insomnia 05/15/2014  . Migraine without aura and without status migrainosus, not intractable 05/15/2014     1. Birth control counseling   2. Initiation of OCP (BCP)   3. PCO (polycystic ovaries)        Plan:            1.  OCPs The risks /benefits of OCPs have been explained to the patient in detail.  Product literature has been given to her.  I have instructed her in the use of OCPs and have given her literature reinforcing this information.  I have explained to the patient that OCPs are not as effective for birth control during the first month of use, and that another form of contraception should be used during this time.  Both first-day start and Sunday start have been explained.  The risks and benefits of each was discussed.  She has been made aware of  the fact that other medications may affect the efficacy of OCPs.  I have answered all of her questions, and I believe that she has an understanding of the effectiveness and use of OCPs. We will begin [place] Orders No orders of the defined types were placed in this  encounter.    Meds ordered this encounter  Medications  . desogestrel-ethinyl estradiol (MIRCETTE) 0.15-0.02/0.01 MG (21/5) tablet    Sig: Take 1 tablet by mouth at bedtime.    Dispense:  3 Package    Refill:  1      F/U  No follow-ups on file. I spent 17 minutes involved in the care of this patient of which greater than 50% was spent discussing PCO and the use of OCPs, how to take OCPs, different types of OCPs risks and benefits etc.  All questions answered.  Finis Bud, M.D. 12/02/2018 10:25 AM

## 2018-12-02 NOTE — Progress Notes (Signed)
Patient comes in today for PCOS follow up. She would like to discuss going on [facility] to help with the PCOS.

## 2018-12-29 ENCOUNTER — Emergency Department
Admission: EM | Admit: 2018-12-29 | Discharge: 2018-12-29 | Disposition: A | Discharge status: Home / Self Care | Payer: Self-pay | Attending: Emergency Medicine | Admitting: Emergency Medicine

## 2018-12-29 ENCOUNTER — Encounter: Payer: Self-pay | Admitting: Emergency Medicine

## 2018-12-29 ENCOUNTER — Other Ambulatory Visit: Payer: Self-pay

## 2018-12-29 ENCOUNTER — Emergency Department: Payer: Self-pay

## 2018-12-29 DIAGNOSIS — R000 Tachycardia, unspecified: Secondary | ICD-10-CM | POA: Insufficient documentation

## 2018-12-29 DIAGNOSIS — Z79899 Other long term (current) drug therapy: Secondary | ICD-10-CM | POA: Insufficient documentation

## 2018-12-29 DIAGNOSIS — N938 Other specified abnormal uterine and vaginal bleeding: Secondary | ICD-10-CM | POA: Insufficient documentation

## 2018-12-29 DIAGNOSIS — N939 Abnormal uterine and vaginal bleeding, unspecified: Secondary | ICD-10-CM

## 2018-12-29 DIAGNOSIS — R58 Hemorrhage, not elsewhere classified: Secondary | ICD-10-CM

## 2018-12-29 LAB — CBC WITH DIFFERENTIAL/PLATELET
Abs Immature Granulocytes: 0.02 10*3/uL (ref 0.00–0.07)
Basophils Absolute: 0 10*3/uL (ref 0.0–0.1)
Basophils Relative: 0 %
Eosinophils Absolute: 0 10*3/uL (ref 0.0–0.5)
Eosinophils Relative: 0 %
HCT: 41.9 % (ref 36.0–46.0)
Hemoglobin: 15 g/dL (ref 12.0–15.0)
Immature Granulocytes: 0 %
Lymphocytes Relative: 11 %
Lymphs Abs: 1.2 10*3/uL (ref 0.7–4.0)
MCH: 30.5 pg (ref 26.0–34.0)
MCHC: 35.8 g/dL (ref 30.0–36.0)
MCV: 85.2 fL (ref 80.0–100.0)
Monocytes Absolute: 0.8 10*3/uL (ref 0.1–1.0)
Monocytes Relative: 7 %
Neutro Abs: 9 10*3/uL — ABNORMAL HIGH (ref 1.7–7.7)
Neutrophils Relative %: 82 %
Platelets: 275 10*3/uL (ref 150–400)
RBC: 4.92 MIL/uL (ref 3.87–5.11)
RDW: 11.5 % (ref 11.5–15.5)
WBC: 11 10*3/uL — ABNORMAL HIGH (ref 4.0–10.5)
nRBC: 0 % (ref 0.0–0.2)

## 2018-12-29 LAB — COMPREHENSIVE METABOLIC PANEL
ALT: 17 U/L (ref 0–44)
AST: 22 U/L (ref 15–41)
Albumin: 4.9 g/dL (ref 3.5–5.0)
Alkaline Phosphatase: 42 U/L (ref 38–126)
Anion gap: 12 (ref 5–15)
BUN: 9 mg/dL (ref 6–20)
CO2: 20 mmol/L — ABNORMAL LOW (ref 22–32)
Calcium: 9.2 mg/dL (ref 8.9–10.3)
Chloride: 104 mmol/L (ref 98–111)
Creatinine, Ser: 0.93 mg/dL (ref 0.44–1.00)
GFR calc Af Amer: >60 mL/min (ref >60)
GFR calc non Af Amer: >60 mL/min (ref >60)
Glucose, Bld: 134 mg/dL — ABNORMAL HIGH (ref 70–99)
Potassium: 2.8 mmol/L — ABNORMAL LOW (ref 3.5–5.1)
Sodium: 136 mmol/L (ref 135–145)
Total Bilirubin: 0.7 mg/dL (ref 0.3–1.2)
Total Protein: 8.1 g/dL (ref 6.5–8.1)

## 2018-12-29 LAB — URINALYSIS, COMPLETE (UACMP) WITH MICROSCOPIC
Bacteria, UA: NONE SEEN
Bilirubin Urine: NEGATIVE
Glucose, UA: NEGATIVE mg/dL
Ketones, ur: 20 mg/dL — AB
Leukocytes,Ua: NEGATIVE
Nitrite: NEGATIVE
Protein, ur: 30 mg/dL — AB
RBC / HPF: >50 RBC/hpf — ABNORMAL HIGH (ref 0–5)
Specific Gravity, Urine: 1.015 (ref 1.005–1.030)
pH: 6 (ref 5.0–8.0)

## 2018-12-29 LAB — TROPONIN I (HIGH SENSITIVITY): Troponin I (High Sensitivity): <2 ng/L (ref <18)

## 2018-12-29 LAB — POCT PREGNANCY, URINE: Preg Test, Ur: NEGATIVE

## 2018-12-29 LAB — TYPE AND SCREEN
ABO/RH(D): O NEG
Antibody Screen: NEGATIVE

## 2018-12-29 MED ORDER — POTASSIUM CHLORIDE 10 MEQ/100ML IV SOLN
10.0000 meq | Freq: Once | INTRAVENOUS | Status: DC
  Filled 2018-12-29: qty 100

## 2018-12-29 MED ORDER — ONDANSETRON HCL 4 MG/2ML IJ SOLN
4.0000 mg | Freq: Once | INTRAMUSCULAR | Status: AC
  Administered 2018-12-29: 4 mg via INTRAVENOUS
  Filled 2018-12-29: qty 2

## 2018-12-29 MED ORDER — POTASSIUM CHLORIDE CRYS ER 20 MEQ PO TBCR
60.0000 meq | EXTENDED_RELEASE_TABLET | Freq: Once | ORAL | Status: AC
  Administered 2018-12-29: 03:00:00 60 meq via ORAL
  Filled 2018-12-29: qty 3

## 2018-12-29 MED ORDER — SODIUM CHLORIDE 0.9 % IV BOLUS
1000.0000 mL | Freq: Once | INTRAVENOUS | Status: AC
  Administered 2018-12-29: 1000 mL via INTRAVENOUS

## 2018-12-29 NOTE — ED Notes (Signed)
E-signature not working at this time. Pt verbalized understanding of D/C instructions, prescriptions and follow up care with no further questions at this time. Pt in NAD and ambulatory at time of D/C.  

## 2018-12-29 NOTE — ED Notes (Signed)
Pt unhooked from cardiac monitor to use toilet.

## 2018-12-29 NOTE — ED Notes (Signed)
Pt removed diva cup and this was placed in a urine hat

## 2018-12-29 NOTE — ED Notes (Signed)
Pt otf for imaging 

## 2018-12-29 NOTE — Discharge Instructions (Addendum)
Continue all medicines as directed by your doctor.  Return to the ER for recurrent worsening symptoms, persistent vomiting, soaking more than 1 pad per hour, fainting or other concerns.

## 2018-12-29 NOTE — ED Provider Notes (Signed)
[facility] Emergency Department Provider Note   ____________________________________________   First MD Initiated Contact with Patient 12/29/18 0148     (approximate)  I have reviewed the triage vital signs and the nursing notes.   HISTORY  Chief Complaint Vaginal Bleeding    HPI Gina Green is a 18 y.o. female who presents to the ED from home with a chief complaint of vaginal bleeding.  Patient sees Dr. Amalia Hailey from GYN and states this is the first month she has had a period while on OCPs.  States she had an ultrasound in June and diagnosed with PCOS.  Also states she has POTS and has chronic issues with tachycardia which is increased with stress.  Has not been able to see a cardiologist in 2 years due to lack of insurance.  Began bleeding 4 days ago with pelvic cramping.  Reports clots and feeling lightheaded.  Denies fever, cough, chest pain, shortness of breath, nausea, vomiting.  Denies anticoagulant use.      Past Medical History:  Diagnosis Date   Amenorrhea    Anxiety    Anxiety state 05/31/2015   Depression    Fibromyalgia    Generalized anxiety disorder 05/15/2014   Hearing loss    Insomnia 05/15/2014   Migraine without aura and without status migrainosus, not intractable 05/15/2014   Moderate headache 05/31/2015   Nystagmus    Palpitations    Panic attack 05/15/2014   POTS (postural orthostatic tachycardia syndrome) 10/19/2016   Pre-syncope    Seizure-like activity (HCC) 05/31/2015   Tension headache 05/15/2014    Patient Active Problem List   Diagnosis Date Noted   Multiple neurological symptoms 02/05/2017   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 Authorizing Provider  ALPRAZolam (XANAX) 0.25 MG tablet Take 0.25 mg by mouth 2 (two) times daily.    [provider]  busPIRone (BUSPAR) 7.5 MG tablet Take 7.5 mg by mouth 2 (two) times daily.     [provider]  desogestrel-ethinyl estradiol (MIRCETTE) 0.15-0.02/0.01 MG (21/5) tablet Take 1 tablet by mouth at bedtime. 12/02/18   Harlin Heys, MD  Desvenlafaxine Succinate ER (PRISTIQ) 25 MG TB24 Take by mouth.    [provider]  DULoxetine (CYMBALTA) 30 MG capsule Take 30 mg by mouth daily.    [provider]  oxyCODONE-acetaminophen (PERCOCET) 5-325 MG tablet Take 1 tablet by mouth every 4 (four) hours as needed. Patient not taking: Reported on 12/02/2018 08/05/18 08/05/19  Gregor Hams, MD  QUEtiapine (SEROQUEL) 100 MG tablet Take 100 mg by mouth at bedtime.    [provider]  topiramate (TOPAMAX) 25 MG tablet Take 1 tablet (25 mg total) by mouth 2 (two) times daily. Patient not taking: Reported on 12/02/2018 02/05/17   Melvenia Beam, MD  traZODone (DESYREL) 50 MG tablet Take 50 mg by mouth daily.     [provider]    Allergies Hpv 9-valent recomb  vaccine and Other  Family History  Problem Relation Age of Onset   High Cholesterol Mother    Thyroid disease Mother    Depression Mother    Anxiety disorder Mother    Migraines Mother    Anxiety disorder Father    High Cholesterol Maternal Grandmother    Thyroid disease Maternal Grandmother    Lung cancer Maternal Grandmother    Cancer Maternal Grandfather        MOUTH   Cirrhosis Maternal Grandfather    Cancer Paternal Grandmother        SKIN   Heart  disease Other        maternal great grandparents, both had MI & strokes    Social History Social History   Tobacco Use   Smoking status: Never Smoker   Smokeless tobacco: Never Used  Substance Use Topics   Alcohol use: No    Alcohol/week: 0.0 standard drinks   Drug use: Yes    Types: Marijuana    Comment: pt denies even past use of drugs    Review of Systems  Constitutional: No fever/chills Eyes: No visual changes. ENT: No sore throat. Cardiovascular: Denies chest pain. Respiratory: Denies shortness of breath. Gastrointestinal: Positive for pelvic pain.  No nausea, no vomiting.  No diarrhea.  No constipation. Genitourinary: Positive for vaginal bleeding.  Negative for dysuria. Musculoskeletal: Negative for back pain. Skin: Negative for rash. Neurological: Negative for headaches, focal weakness or numbness.   ____________________________________________   PHYSICAL EXAM:  VITAL SIGNS: ED Triage Vitals  Enc Vitals Group     BP 12/29/18 0114 131/83     Pulse Rate 12/29/18 0114 (!) 160     Resp 12/29/18 0114 18     Temp 12/29/18 0114 98.4 F (36.9 C)     Temp Source 12/29/18 0114 Oral     SpO2 12/29/18 0114 99 %     Weight 12/29/18 0115 150 lb (68 kg)     Height 12/29/18 0115  (1.575 m)     Head Circumference --      Peak Flow --      Pain Score 12/29/18 0125 5     Pain Loc --      Pain Edu? --      Excl. in GC? --     Constitutional: Alert and oriented. Well appearing and in mild acute distress. Eyes: Conjunctivae are normal. PERRL. EOMI. Head: Atraumatic. Nose: No congestion/rhinnorhea. Mouth/Throat: Mucous membranes are moist.  Oropharynx non-erythematous. Neck: No stridor.   Cardiovascular: Tachycardic rate, regular rhythm. Grossly normal heart sounds.  Good peripheral circulation. Respiratory: Normal respiratory effort.  No retractions. Lungs CTAB. Gastrointestinal: Soft with mild pelvic tenderness to palpation without rebound or guarding. No  distention. No abdominal bruits. No CVA tenderness. Musculoskeletal: No lower extremity tenderness nor edema.  No joint effusions. Neurologic:  Normal speech and language. No gross focal neurologic deficits are appreciated. No gait instability. Skin:  Skin is warm, dry and intact. No rash noted. Psychiatric: Mood and affect are normal. Speech and behavior are normal.  ____________________________________________   LABS (all labs ordered are listed, but only abnormal results are displayed)  Labs Reviewed  CBC WITH DIFFERENTIAL/PLATELET - Abnormal; Notable for the following components:      Result Value   WBC 11.0 (*)    Neutro Abs 9.0 (*)    All other components within normal limits  COMPREHENSIVE METABOLIC PANEL - Abnormal; Notable for the following components:   Potassium 2.8 (*)  CO2 20 (*)    Glucose, Bld 134 (*)    All other components within normal limits  URINALYSIS, COMPLETE (UACMP) WITH MICROSCOPIC - Abnormal; Notable for the following components:   Color, Urine YELLOW (*)    APPearance CLEAR (*)    Hgb urine dipstick LARGE (*)    Ketones, ur 20 (*)    Protein, ur 30 (*)    RBC / HPF >50 (*)    All other components within normal limits  POCT PREGNANCY, URINE  TYPE AND SCREEN  TROPONIN I (HIGH SENSITIVITY)   ____________________________________________  EKG  ED ECG REPORT I, Demya Scruggs J, the attending physician, personally viewed and interpreted this ECG.   Date: 12/29/2018  EKG Time: 0119  Rate: 148  Rhythm: sinus tachycardia  Axis: Normal   Intervals:none  ST&T Change: Nonspecific  ____________________________________________  RADIOLOGY  ED MD interpretation: Unremarkable pelvic ultrasound  Official radiology report(s): [place] Pelvic Complete W Transvaginal And Torsion R/o  Result Date: 12/29/2018 CLINICAL DATA:  Vaginal bleeding EXAM: TRANSABDOMINAL AND TRANSVAGINAL ULTRASOUND OF PELVIS DOPPLER ULTRASOUND OF OVARIES TECHNIQUE: Both transabdominal and  transvaginal ultrasound examinations of the pelvis were performed. Transabdominal technique was performed for global imaging of the pelvis including uterus, ovaries, adnexal regions, and pelvic cul-de-sac. It was necessary to proceed with endovaginal exam following the transabdominal exam to visualize the uterus, endometrium, ovaries and adnexa. Color and duplex Doppler ultrasound was utilized to evaluate blood flow to the ovaries. COMPARISON:  None. FINDINGS: Uterus Measurements: 6.7 x 3.3 x 4.8 cm = volume: 55 mL. No fibroids or other mass visualized. Endometrium Thickness: 8 mm in thickness.  No focal abnormality visualized. Right ovary Measurements: 3.5 x 2.2 x 2.8 cm = volume: 11 mL. Numerous follicles throughout the right ovary. No adnexal mass. Left ovary Measurements: 3.2 x 2.1 x 2.9 cm = volume: 10 mL. Numerous follicles throughout the left ovary. No adnexal mass. Pulsed Doppler evaluation of both ovaries demonstrates normal low-resistance arterial and venous waveforms. Other findings No abnormal free fluid. IMPRESSION: No acute findings.  No evidence of ovarian mass or torsion. Electronically Signed   By: Charlett NoseKevin  Dover M.D.   On: 12/29/2018 03:15    ____________________________________________   PROCEDURES  Procedure(s) performed (including Critical Care):  Procedures   ____________________________________________   INITIAL IMPRESSION / ASSESSMENT AND PLAN / ED COURSE  As part of my medical decision making, I reviewed the following data within the electronic MEDICAL RECORD NUMBER Nursing notes reviewed and incorporated, Labs reviewed, Radiograph reviewed and Notes from prior ED visits     Gina Green was evaluated in Emergency Department on 12/29/2018 for the symptoms described in the history of present illness. She was evaluated in the context of the global COVID-19 pandemic, which necessitated consideration that the patient might be at risk for infection with the SARS-CoV-2 virus that  causes COVID-19. Institutional protocols and algorithms that pertain to the evaluation of patients at risk for COVID-19 are in a state of rapid change based on information released by regulatory bodies including the CDC and federal and state organizations. These policies and algorithms were followed during the patient's care in the ED.    18 year old female with PCOS and POTS who presents with vaginal bleeding. Differential diagnosis includes, but is not limited to, ovarian cyst, ovarian torsion, acute appendicitis, diverticulitis, urinary tract infection/pyelonephritis, endometriosis, bowel obstruction, colitis, renal colic, gastroenteritis, hernia, fibroids, endometriosis, pregnancy related pain including ectopic pregnancy, etc.  H/H stable, patient hypokalemic. Will initiate IV fluid  resuscitation and obtain pelvic US.  Clinical Course as of Dec 29 454  Thu Dec 29, 2018  6948 Updated patient of all test results.  Heart rate improving; currently 113.  IV fluids infusing.   [JS]  0455 Heart rate 95 after second liter IV fluids.  Patient will follow-up with her gynecologist closely.  Strict return precautions given.  Patient verbalizes understanding and agrees with plan of care.   [JS]    Clinical Course User Index [JS] Irean Hong, MD     ____________________________________________   FINAL CLINICAL IMPRESSION(S) / ED DIAGNOSES  Final diagnoses:  Bleeding  Vaginal bleeding  Tachycardia     ED Discharge Orders    None       Note:  This document was prepared using Dragon voice recognition software and may include unintentional dictation errors.   Irean Hong, MD 12/29/18 [phone]

## 2018-12-29 NOTE — ED Triage Notes (Addendum)
Patient ambulatory to triage with steady gait, without difficulty or distress noted, mask in place; pt reports vag bleeding and abd cramping x 4 days; st just began [facility] pills and has PCOS

## 2018-12-29 NOTE — ED Notes (Signed)
Report given to Paige RN.

## 2018-12-29 NOTE — ED Notes (Signed)
Pt speaking with this RN in NAD, reports mild cramping in lower abdomen, states she feels this when a clot is passing. Pt reports when her HR is high she feels like she is going to pass out.

## 2019-01-04 NOTE — Telephone Encounter (Signed)
Pt is requesting a tela visit she isnt feeling and she needs a refill on birth control. PLEase advise

## 2019-02-03 ENCOUNTER — Encounter: Payer: Self-pay | Admitting: Obstetrics and Gynecology

## 2019-02-13 ENCOUNTER — Other Ambulatory Visit: Payer: Self-pay | Admitting: Surgical

## 2019-02-13 DIAGNOSIS — E282 Polycystic ovarian syndrome: Secondary | ICD-10-CM

## 2019-02-13 DIAGNOSIS — Z30011 Encounter for initial prescription of contraceptive pills: Secondary | ICD-10-CM

## 2019-02-13 DIAGNOSIS — Z3009 Encounter for other general counseling and advice on contraception: Secondary | ICD-10-CM

## 2019-02-13 MED ORDER — DESOGESTREL-ETHINYL ESTRADIOL 0.15-0.02/0.01 MG (21/5) PO TABS: 1.0000 | ORAL_TABLET | Freq: Every day | ORAL | 0 refills | Status: DC

## 2019-03-22 ENCOUNTER — Telehealth: Payer: Self-pay | Admitting: Surgical

## 2019-03-22 DIAGNOSIS — E282 Polycystic ovarian syndrome: Secondary | ICD-10-CM

## 2019-03-22 DIAGNOSIS — Z3009 Encounter for other general counseling and advice on contraception: Secondary | ICD-10-CM

## 2019-03-22 DIAGNOSIS — Z30011 Encounter for initial prescription of contraceptive pills: Secondary | ICD-10-CM

## 2019-03-22 NOTE — Telephone Encounter (Signed)
I really need a refill available for my medication before I run out. I only have a few days left

## 2019-03-23 MED ORDER — DESOGESTREL-ETHINYL ESTRADIOL 0.15-0.02/0.01 MG (21/5) PO TABS: 1.0000 | ORAL_TABLET | Freq: Every day | ORAL | 0 refills | Status: DC

## 2019-03-23 NOTE — Telephone Encounter (Signed)
One month supply sent to pharmacy and responded to her my chart message that one month has been sent in to pharmacy. Per Dr. Logan Bores she would need to make an appointment for further refills.

## 2019-04-12 ENCOUNTER — Other Ambulatory Visit: Payer: Self-pay

## 2019-04-12 ENCOUNTER — Ambulatory Visit: Payer: Self-pay | Admitting: Obstetrics and Gynecology

## 2019-04-12 ENCOUNTER — Encounter: Payer: Self-pay | Admitting: Obstetrics and Gynecology

## 2019-04-12 VITALS — BP 102/72 | HR 92 | Ht 62.0 in | Wt 167.8 lb

## 2019-04-12 DIAGNOSIS — Z30015 Encounter for initial prescription of vaginal ring hormonal contraceptive: Secondary | ICD-10-CM

## 2019-04-12 DIAGNOSIS — F419 Anxiety disorder, unspecified: Secondary | ICD-10-CM

## 2019-04-12 DIAGNOSIS — G43009 Migraine without aura, not intractable, without status migrainosus: Secondary | ICD-10-CM

## 2019-04-12 DIAGNOSIS — I498 Other specified cardiac arrhythmias: Secondary | ICD-10-CM

## 2019-04-12 DIAGNOSIS — F329 Major depressive disorder, single episode, unspecified: Secondary | ICD-10-CM

## 2019-04-12 DIAGNOSIS — G90A Postural orthostatic tachycardia syndrome (POTS): Secondary | ICD-10-CM

## 2019-04-12 DIAGNOSIS — E282 Polycystic ovarian syndrome: Secondary | ICD-10-CM

## 2019-04-12 MED ORDER — ETONOGESTREL-ETHINYL ESTRADIOL 0.12-0.015 MG/24HR VA RING: VAGINAL_RING | VAGINAL | 11 refills | Status: DC

## 2019-04-12 NOTE — Progress Notes (Signed)
GYNECOLOGY CLINIC PROGRESS NOTE Subjective:    Gina Green is a 19 y.o. [id number] female who presents for contraception management.  She was previously seen by Dr. Brennan Bailey.  The patient has no complaints today. The patient is sexually active. Pertinent past medical history: PCOS, history of seizure-like activity, anxiety and depression with panic attacks, and migraines (without aura).  She is currently using Mircette (combined 20 mcg OCP) for management of her heavy cycles. Has been taking continuously to avoid having a cycle.  Notes that she desired a refill but was told that she should not be taking the pills continuously. Notes she is thinking of trying the NuvaRing as she heard that it can be used continuously. The OCPs initially affected her POTS syndrome, however this has improved.    Menstrual History: OB History  Gravida Para Term Preterm AB Living  1       1    SAB TAB Ectopic Multiple Live Births  1            # Outcome Date GA Lbr Len/2nd Weight Sex Delivery Anes PTL Lv  1 SAB 04/2018            Patient's last menstrual period was 12/26/2018.    The following portions of the patient's history were reviewed and updated as appropriate:  She  has a past medical history of Amenorrhea, Anxiety, Anxiety state (05/31/2015), Depression, Fibromyalgia, Generalized anxiety disorder (05/15/2014), Hearing loss, Insomnia (05/15/2014), Migraine without aura and without status migrainosus, not intractable (05/15/2014), Moderate headache (05/31/2015), Nystagmus, Palpitations, Panic attack (05/15/2014), POTS (postural orthostatic tachycardia syndrome) (10/19/2016), Pre-syncope, Seizure-like activity (HCC) (05/31/2015), and Tension headache (05/15/2014).   She  has a past surgical history that includes No past surgeries.   Her family history includes Anxiety disorder in her father and mother; Cancer in her maternal grandfather and paternal grandmother; Cirrhosis in her maternal grandfather; Depression  in her mother; Heart disease in an other family member; High Cholesterol in her maternal grandmother and mother; Lung cancer in her maternal grandmother; Migraines in her mother; Thyroid disease in her maternal grandmother and mother.   She  reports that she has never smoked. She has never used smokeless tobacco. She reports previous drug use. Drug: Marijuana. She reports that she does not drink alcohol.   Current Outpatient Medications on File Prior to Visit  Medication Sig Dispense Refill  . ALPRAZolam (XANAX) 0.25 MG tablet Take 0.25 mg by mouth 2 (two) times daily.    Marland Kitchen desogestrel-ethinyl estradiol (MIRCETTE) 0.15-0.02/0.01 MG (21/5) tablet Take 1 tablet by mouth at bedtime. 1 Package 0  . topiramate (TOPAMAX) 25 MG tablet Take 1 tablet (25 mg total) by mouth 2 (two) times daily. 180 tablet 6   No current facility-administered medications on file prior to visit.   She is allergic to hpv 9-valent recomb vaccine and other..  Review of Systems Pertinent items noted in HPI and remainder of comprehensive ROS otherwise negative.   Objective:    BP 102/72   Pulse 92   Ht 5\' 2"  (1.575 m)   Wt 167 lb 12.8 oz (76.1 kg)   LMP 12/26/2018   BMI 30.69 kg/m  General appearance: alert and no distress Lungs: clear to auscultation bilaterally Heart: regular rate and rhythm, S1, S2 normal, no murmur, click, rub or gallop Abdomen: soft, non-tender; bowel sounds normal; no masses,  no organomegaly Pelvic: deferred   Skin: no sores or suspicious lesions or rashes or color changes   Assessment:  19 y.o., for contraception counseling and management  PCOS Migraines Anxiety and depression POTS syndrome  Plan:   - Discussed possible options available for contraception due to patient's medical history. Advised that if desired, she could indeed continue her current pill regimen as a continuous method, however noted that some patients do experience breakthrough bleeding after 3-4 months. If this  did occur, she may need to have at least 1 hormone free week every 3-4 months to have a cycle and can change to a different OCP regimen.  Also discussed LARC (Nexplanon, hormonal IUD), NuvaRing which could also be used continuously. Patient desires to try NuvaRing. Will prescribe. Patient is self-pay.  If medication is cost-prohibitive, can return to OCPs.   - Patient made aware that use of Topamax at high doses can decrease effectiveness of birth control method, however at current dose she should be fine.  - Patient needs an annual exam as she turns age 72 in May.  Will schedule in 2-3 months, or can f/u with Health Department as she is uninsured.  Also given info to apply for [facility].    Rubie Maid, MD [facility]

## 2019-04-12 NOTE — Progress Notes (Signed)
Pt present today f/u birth control. Pt stated that she would like to discuss other forms of birth control besides the pills.

## 2019-04-12 NOTE — Patient Instructions (Signed)
Ethinyl Estradiol; Etonogestrel vaginal ring What is this medicine? ETHINYL ESTRADIOL; ETONOGESTREL (ETH in il es tra DYE ole; et oh noe JES trel) vaginal ring is a flexible, vaginal ring used as a contraceptive (birth control method). This medicine combines 2 types of female hormones, an estrogen and a progestin. This ring is used to prevent ovulation and pregnancy. Each ring is effective for 1 month. This medicine may be used for other purposes; ask your health care provider or pharmacist if you have questions. COMMON BRAND NAME(S): EluRyng, NuvaRing What should I tell my health care provider before I take this medicine? They need to know if you have any of these conditions:  abnormal vaginal bleeding  blood vessel disease or blood clots  breast, cervical, endometrial, ovarian, liver, or uterine cancer  diabetes  gallbladder disease  having surgery  heart disease or recent heart attack  high blood pressure  high cholesterol or triglycerides  history of irregular heartbeat or heart valve problems  kidney disease  liver disease  migraine headaches  protein C deficiency  protein S deficiency  recently had a baby, miscarriage, or abortion  stroke  systemic lupus erythematosus (SLE)  tobacco smoker  your age is more than 21 years old  an unusual or allergic reaction to estrogens, progestins, other medicines, foods, dyes, or preservatives  pregnant or trying to get pregnant  breast-feeding How should I use this medicine? Insert the ring into your vagina as directed. Follow the directions on the prescription label. The ring will remain place for 3 weeks and is then removed for a 1-week break. A new ring is inserted 1 week after the last ring was removed, on the same day of the week. Check often to make sure the ring is still in place. If the ring was out of the vagina for an unknown amount of time, you may not be protected from pregnancy. Perform a pregnancy test and  call your doctor. Do not use more often than directed. A patient package insert for the product will be given with each prescription and refill. Read this sheet carefully each time. The sheet may change frequently. Contact your pediatrician regarding the use of this medicine in children. Special care may be needed. Overdosage: If you think you have taken too much of this medicine contact a poison control center or emergency room at once. NOTE: This medicine is only for you. Do not share this medicine with others. What if I miss a dose? You will need to use the ring exactly as directed. It is very important to follow the schedule every cycle. If you do not use the ring as directed, you may not be protected from pregnancy. If the ring should slip out, is lost, or if you leave it in longer or shorter than you should, contact your health care professional for advice. What may interact with this medicine? Do not take this medicine with the following medications:  dasabuvir; ombitasvir; paritaprevir; ritonavir  ombitasvir; paritaprevir; ritonavir  vaginal lubricants or other vaginal products that are oil-based or silicone-based This medicine may also interact with the following medications:  acetaminophen  antibiotics or medicines for infections, especially rifampin, rifabutin, rifapentine, and griseofulvin, and possibly penicillins or tetracyclines  aprepitant or fosaprepitant  armodafinil  ascorbic acid (vitamin C)  barbiturate medicines, such as phenobarbital or primidone  bosentan  certain antiviral medicines for hepatitis, HIV or AIDS  certain medicines for cancer treatment  certain medicines for seizures like carbamazepine, clobazam, felbamate, lamotrigine, oxcarbazepine, phenytoin,   rufinamide, topiramate  certain medicines for treating high cholesterol  cyclosporine  dantrolene  elagolix  flibanserin  grapefruit juice  lesinurad  medicines for diabetes  medicines  to treat fungal infections, such as griseofulvin, miconazole, fluconazole, ketoconazole, itraconazole, posaconazole or voriconazole  mifepristone  mitotane  modafinil  morphine  mycophenolate  St. John's wort  tamoxifen  temazepam  theophylline or aminophylline  thyroid hormones  tizanidine  tranexamic acid  ulipristal  warfarin This list may not describe all possible interactions. Give your health care provider a list of all the medicines, herbs, non-prescription drugs, or dietary supplements you use. Also tell them if you smoke, drink alcohol, or use illegal drugs. Some items may interact with your medicine. What should I watch for while using this medicine? Visit your doctor or health care professional for regular checks on your progress. You will need a regular breast and pelvic exam and Pap smear while on this medicine. Check with your doctor or health care professional to see if you need an additional method of contraception during the first cycle that you use this ring. Female condoms (made with natural rubber latex, polyisoprene, and polyurethane) and spermicides may be used. Do not use a diaphragm, cervical cap, or a female condom, as the ring can interfere with these birth control methods and their proper placement. If you have any reason to think you are pregnant, stop using this medicine right away and contact your doctor or health care professional. If you are using this medicine for hormone related problems, it may take several cycles of use to see improvement in your condition. Smoking increases the risk of getting a blood clot or having a stroke while you are using hormonal birth control, especially if you are more than 21 years old. You are strongly advised not to smoke. Some women are prone to getting dark patches on the skin of the face (cholasma). Your risk of getting chloasma with this medicine is higher if you had chloasma during a pregnancy. Keep out of the  sun. If you cannot avoid being in the sun, wear protective clothing and use sunscreen. Do not use sun lamps or tanning beds/booths. This medicine can make your body retain fluid, making your fingers, hands, or ankles swell. Your blood pressure can go up. Contact your doctor or health care professional if you feel you are retaining fluid. If you are going to have elective surgery, you may need to stop using this medicine before the surgery. Consult your health care professional for advice. This medicine does not protect you against HIV infection (AIDS) or any other sexually transmitted diseases. What side effects may I notice from receiving this medicine? Side effects that you should report to your doctor or health care professional as soon as possible:  allergic reactions such as skin rash or itching, hives, swelling of the lips, mouth, tongue, or throat  depression  high blood pressure  migraines or severe, sudden headaches  signs and symptoms of a blood clot such as breathing problems; changes in vision; chest pain; severe, sudden headache; pain, swelling, warmth in the leg; trouble speaking; sudden numbness or weakness of the face, arm or leg  signs and symptoms of infection like fever or chills with dizziness and a sunburn-like rash, or pain or trouble passing urine  stomach pain  symptoms of vaginal infection like itching, irritation or unusual discharge  yellowing of the eyes or skin Side effects that usually do not require medical attention (report these to your doctor   or health care professional if they continue or are bothersome):  acne  breast pain, tenderness  irregular vaginal bleeding or spotting, particularly during the first month of use  mild headache  nausea  painful periods  vomiting This list may not describe all possible side effects. Call your doctor for medical advice about side effects. You may report side effects to FDA at 1-800-FDA-1088. Where should I  keep my medicine? Keep out of the reach of children. Store unopened medicine for up to 4 months at room temperature at 15 and 30 degrees C (59 and 86 degrees F). Protect from light. Do not store above 30 degrees C (86 degrees F). Throw away any unused medicine 4 months after the dispense date or the expiration date, whichever comes first. A ring may only be used for 1 cycle (1 month). After the 3-week cycle, a used ring is removed and should be placed in the re-closable foil pouch and discarded in the trash out of reach of children and pets. Do NOT flush down the toilet. NOTE: This sheet is a summary. It may not cover all possible information. If you have questions about this medicine, talk to your doctor, pharmacist, or health care provider.  2020 Elsevier/Gold Standard (2018-08-04 12:31:47)  

## 2019-04-14 ENCOUNTER — Telehealth: Payer: Self-pay | Admitting: Obstetrics and Gynecology

## 2019-04-14 ENCOUNTER — Other Ambulatory Visit: Payer: Self-pay

## 2019-04-14 DIAGNOSIS — Z30011 Encounter for initial prescription of contraceptive pills: Secondary | ICD-10-CM

## 2019-04-14 DIAGNOSIS — E282 Polycystic ovarian syndrome: Secondary | ICD-10-CM

## 2019-04-14 DIAGNOSIS — Z3009 Encounter for other general counseling and advice on contraception: Secondary | ICD-10-CM

## 2019-04-14 MED ORDER — DESOGESTREL-ETHINYL ESTRADIOL 0.15-0.02/0.01 MG (21/5) PO TABS: 1.0000 | ORAL_TABLET | Freq: Every day | ORAL | 0 refills | Status: DC

## 2019-04-14 NOTE — Telephone Encounter (Signed)
Patient called in stating she has left several mychart messages requesting a refill on her birth control. Patient doesn't have any more pills left.

## 2019-04-14 NOTE — Telephone Encounter (Signed)
Please see my chart messages

## 2019-07-16 ENCOUNTER — Other Ambulatory Visit: Payer: Self-pay | Admitting: Obstetrics and Gynecology

## 2019-07-16 DIAGNOSIS — Z3009 Encounter for other general counseling and advice on contraception: Secondary | ICD-10-CM

## 2019-07-16 DIAGNOSIS — E282 Polycystic ovarian syndrome: Secondary | ICD-10-CM

## 2019-07-16 DIAGNOSIS — Z30011 Encounter for initial prescription of contraceptive pills: Secondary | ICD-10-CM

## 2019-07-21 ENCOUNTER — Other Ambulatory Visit: Payer: Self-pay | Admitting: Obstetrics and Gynecology

## 2019-07-21 DIAGNOSIS — E282 Polycystic ovarian syndrome: Secondary | ICD-10-CM

## 2019-07-21 DIAGNOSIS — Z30011 Encounter for initial prescription of contraceptive pills: Secondary | ICD-10-CM

## 2019-07-21 DIAGNOSIS — Z3009 Encounter for other general counseling and advice on contraception: Secondary | ICD-10-CM

## 2019-07-26 ENCOUNTER — Telehealth: Payer: Self-pay

## 2019-07-26 NOTE — Telephone Encounter (Signed)
Pt called no answer LM via VM to call the office to speak about a medication refill we received for her birth control. Pt was advised to please call the office. Sent pt a [employer].

## 2019-07-26 NOTE — Telephone Encounter (Signed)
Pt called no answer LM via Vm to call the office to speak about a medication refill that we received at the office. Pt was advised to please call the office as soon as possible.

## 2019-07-27 ENCOUNTER — Other Ambulatory Visit: Payer: Self-pay

## 2019-07-27 DIAGNOSIS — Z3009 Encounter for other general counseling and advice on contraception: Secondary | ICD-10-CM

## 2019-07-27 DIAGNOSIS — Z30011 Encounter for initial prescription of contraceptive pills: Secondary | ICD-10-CM

## 2019-07-27 DIAGNOSIS — E282 Polycystic ovarian syndrome: Secondary | ICD-10-CM

## 2019-07-27 MED ORDER — DESOGESTREL-ETHINYL ESTRADIOL 0.15-0.02/0.01 MG (21/5) PO TABS: 1.0000 | ORAL_TABLET | Freq: Every day | ORAL | 11 refills | Status: DC

## 2020-02-01 IMAGING — CT CT RENAL STONE PROTOCOL
3 of 4 series · 8 of 46 positions shown, 15 images · non-contrast
Comparison: None.

CLINICAL DATA: Left flank and left lower quadrant abdominal pain.
Vaginal bleeding.

EXAM:
CT ABDOMEN AND PELVIS WITHOUT CONTRAST
TECHNIQUE: Multidetector CT imaging of the abdomen and pelvis was performed
following the standard protocol without IV contrast.

[Series 4: lung bases · axial · 0.69mm/px · z∈[-794,-734]mm · 4 of 21 slices shown, 9 images]
[im 5/21  soft-tissue]
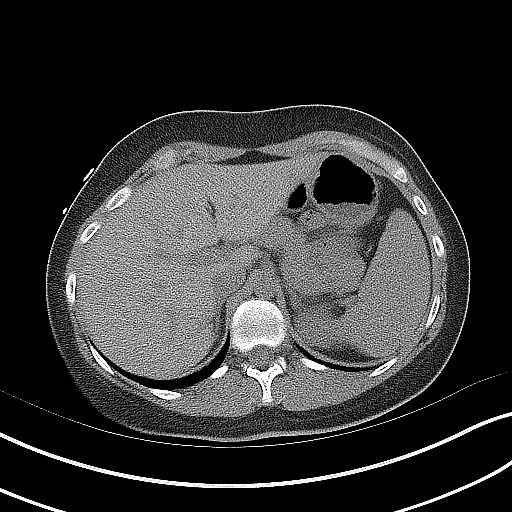
[im 5/21  lung]
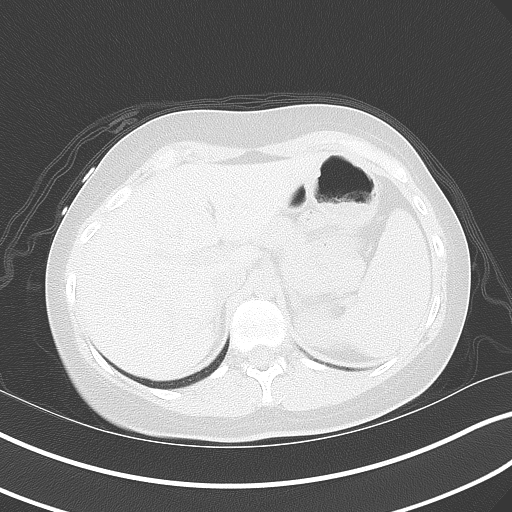
[im 5/21  bone]
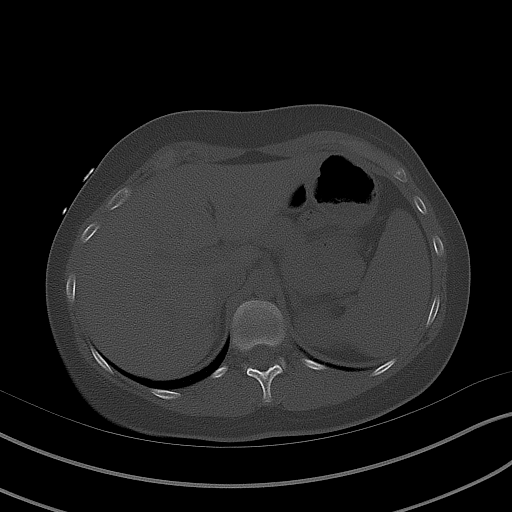
[im 9/21  soft-tissue]
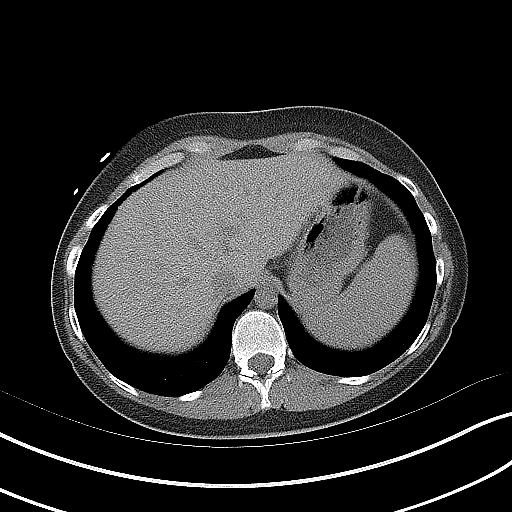
[im 9/21  lung]
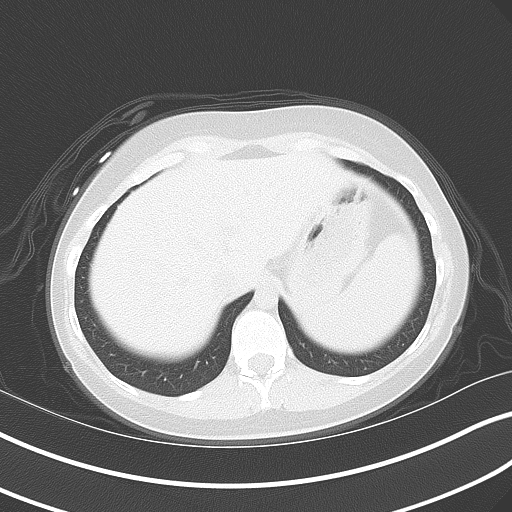
[im 13/21  soft-tissue]
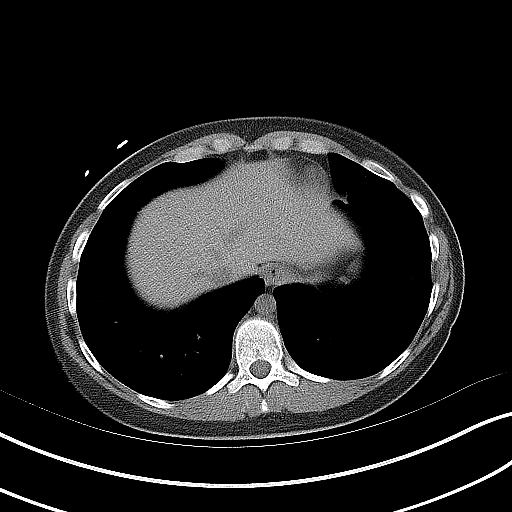
[im 13/21  lung]
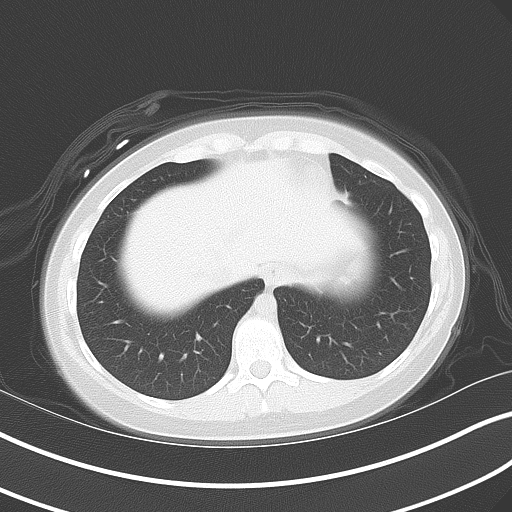
[im 17/21  soft-tissue]
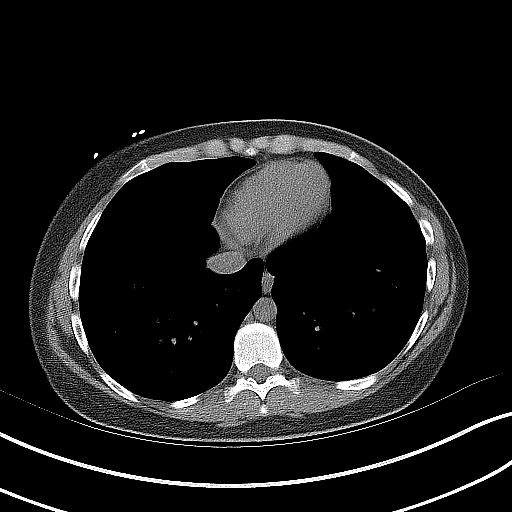
[im 17/21  lung]
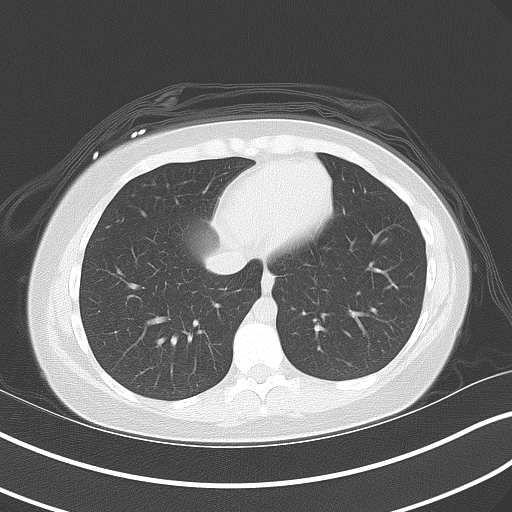

[Series 5: coronal · coronal · 0.68mm/px · 3 of 90 slices shown, 4 images]
[im 30/90  soft-tissue]
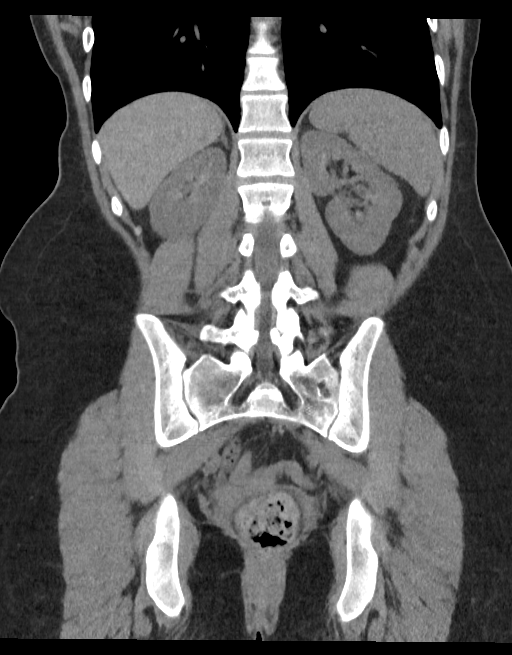
[im 40/90  soft-tissue]
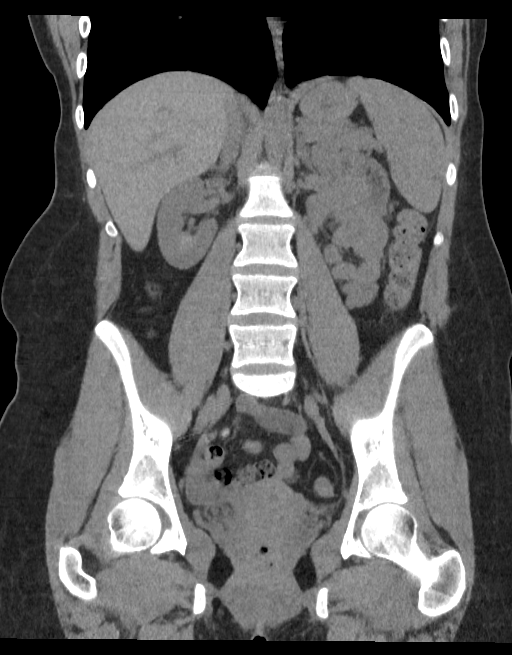
[im 40/90  bone]
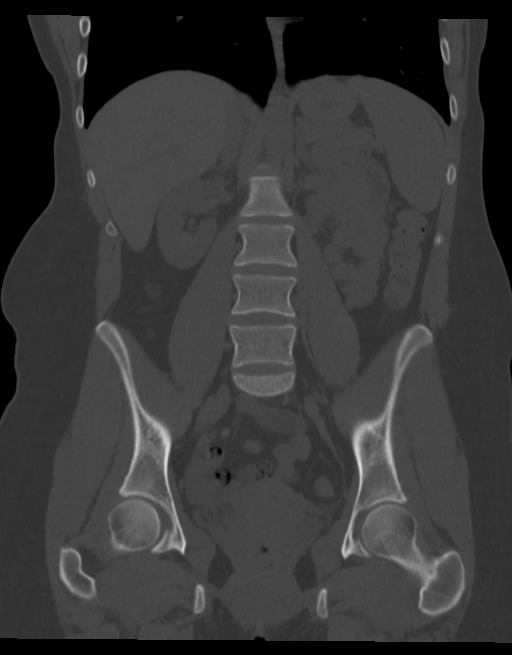
[im 50/90  soft-tissue]
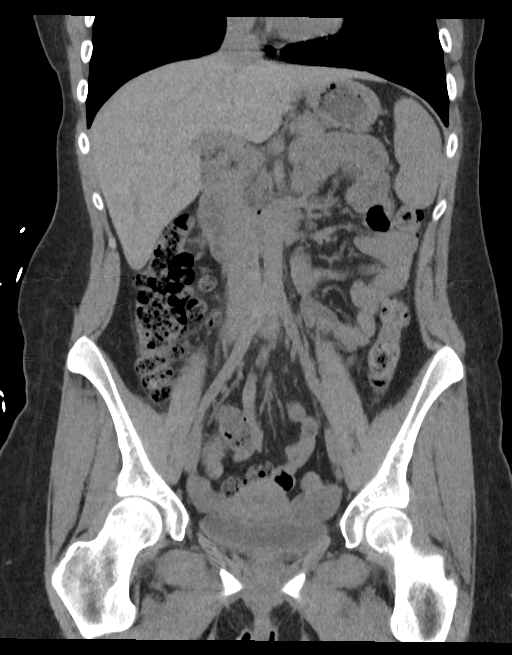

[Series 6: sagittal · sagittal · 0.48mm/px · 1 of 151 slices shown, 2 images]
[im 51/151  soft-tissue]
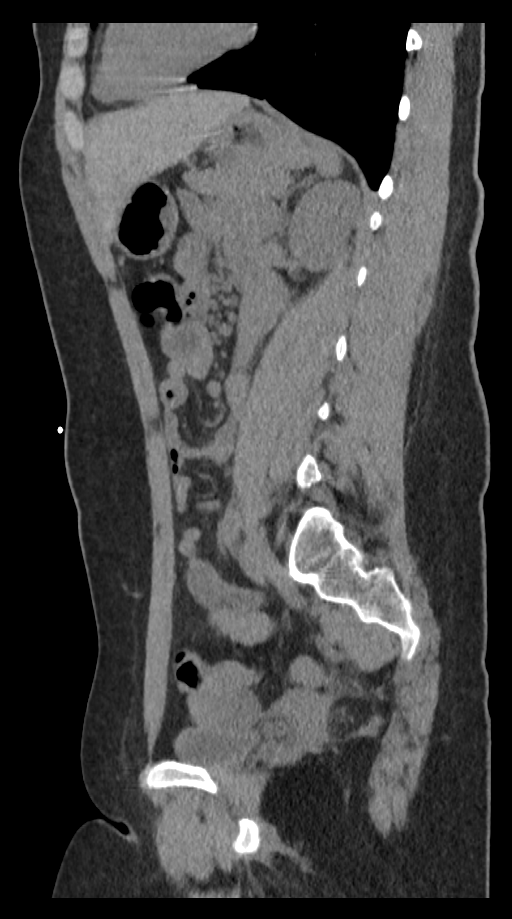
[im 51/151  bone]
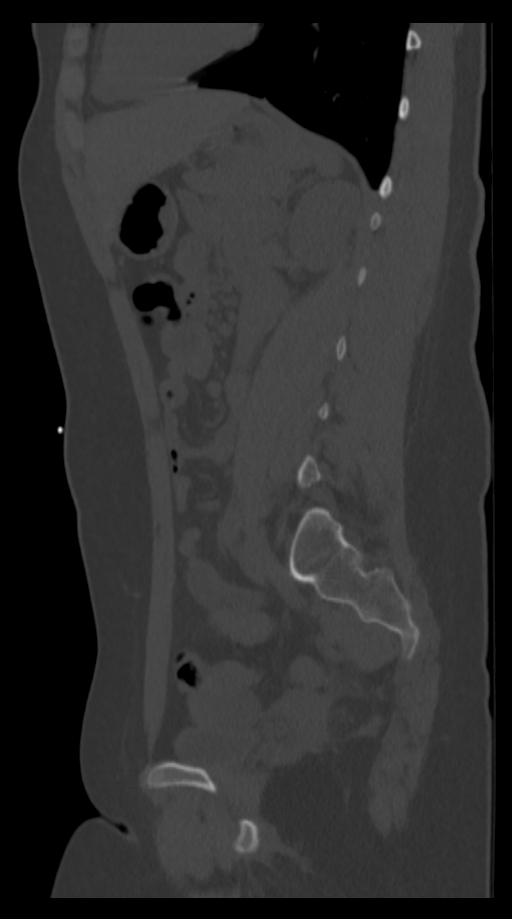

[8 of 46 positions shown; findings below may reference images not displayed]

FINDINGS: Lower chest: Normal.

Hepatobiliary: No focal liver abnormality is seen. No gallstones,
gallbladder wall thickening, or biliary dilatation.

Pancreas: Unremarkable. No pancreatic ductal dilatation or
surrounding inflammatory changes.

Spleen: Normal in size without focal abnormality.

Adrenals/Urinary Tract: Adrenal glands are unremarkable. Kidneys are
normal, without renal calculi, focal lesion, or hydronephrosis.
Bladder is unremarkable.

Stomach/Bowel: Stomach is within normal limits. Appendix appears
normal. No evidence of bowel wall thickening, distention, or
inflammatory changes.

Vascular/Lymphatic: No significant vascular findings are present. No
enlarged abdominal or pelvic lymph nodes.

Reproductive: Uterus and bilateral adnexa are unremarkable.

Other: No abdominal wall hernia or abnormality. No abdominopelvic
ascites.

Musculoskeletal: No acute or significant osseous findings.
IMPRESSION: No CT findings to explain the patient's left lower quadrant pain.
Benign-appearing abdomen and pelvis.

## 2020-04-02 ENCOUNTER — Other Ambulatory Visit: Payer: Self-pay | Admitting: Obstetrics and Gynecology

## 2020-04-02 DIAGNOSIS — Z30011 Encounter for initial prescription of contraceptive pills: Secondary | ICD-10-CM

## 2020-04-02 DIAGNOSIS — Z3009 Encounter for other general counseling and advice on contraception: Secondary | ICD-10-CM

## 2020-04-02 DIAGNOSIS — E282 Polycystic ovarian syndrome: Secondary | ICD-10-CM

## 2020-04-03 NOTE — Telephone Encounter (Signed)
Patient needs appointment prior to next refill.  

## 2020-06-08 ENCOUNTER — Other Ambulatory Visit: Payer: Self-pay | Admitting: Obstetrics and Gynecology

## 2020-06-08 DIAGNOSIS — E282 Polycystic ovarian syndrome: Secondary | ICD-10-CM

## 2020-06-08 DIAGNOSIS — Z3009 Encounter for other general counseling and advice on contraception: Secondary | ICD-10-CM

## 2020-06-08 DIAGNOSIS — Z30011 Encounter for initial prescription of contraceptive pills: Secondary | ICD-10-CM

## 2020-06-23 ENCOUNTER — Other Ambulatory Visit: Payer: Self-pay | Admitting: Obstetrics and Gynecology

## 2020-06-23 DIAGNOSIS — Z3009 Encounter for other general counseling and advice on contraception: Secondary | ICD-10-CM

## 2020-06-23 DIAGNOSIS — E282 Polycystic ovarian syndrome: Secondary | ICD-10-CM

## 2020-06-23 DIAGNOSIS — Z30011 Encounter for initial prescription of contraceptive pills: Secondary | ICD-10-CM

## 2020-06-25 NOTE — Telephone Encounter (Signed)
Patient needs appointment prior to next refill.  

## 2020-07-02 ENCOUNTER — Ambulatory Visit: Payer: Self-pay

## 2020-07-02 NOTE — Telephone Encounter (Signed)
Pt. Positive for COVID 19 last week. Noticed last night purple discoloration around both nipples and has some tenderness. Instructed to call her OB/GYN for evaluation. Verbalizes understanding.  Answer Assessment - Initial Assessment Questions 1. SYMPTOM: "What's the main symptom you're concerned about?"  (e.g., lump, pain, rash, nipple discharge)     Purple color around both nipples 2. LOCATION: "Where is the color change located?"     Both nipples 3. ONSET: "When did color change  start?"     Last night 4. PRIOR HISTORY: "Do you have any history of prior problems with your breasts?" (e.g., lumps, cancer, fibrocystic breast disease)     No 5. CAUSE: "What do you think is causing this symptom?"     Has COVID 19 6. OTHER SYMPTOMS: "Do you have any other symptoms?" (e.g., fever, breast pain, redness or rash, nipple discharge)     Tenderness 7. PREGNANCY-BREASTFEEDING: "Is there any chance you are pregnant?" "When was your last menstrual period?" "Are you breastfeeding?"     Not pregnant  Protocols used: BREAST [facility]

## 2020-07-19 NOTE — Telephone Encounter (Signed)
Pt has an appt scheduled.

## 2020-07-25 ENCOUNTER — Other Ambulatory Visit: Payer: Self-pay

## 2020-07-25 ENCOUNTER — Ambulatory Visit (INDEPENDENT_AMBULATORY_CARE_PROVIDER_SITE_OTHER): Payer: Self-pay | Admitting: Obstetrics and Gynecology

## 2020-07-25 ENCOUNTER — Encounter: Payer: Self-pay | Admitting: Obstetrics and Gynecology

## 2020-07-25 VITALS — BP 107/70 | HR 82 | Ht 62.0 in | Wt 164.1 lb

## 2020-07-25 DIAGNOSIS — E282 Polycystic ovarian syndrome: Secondary | ICD-10-CM

## 2020-07-25 DIAGNOSIS — Z8616 Personal history of COVID-19: Secondary | ICD-10-CM

## 2020-07-25 MED ORDER — METFORMIN HCL 500 MG PO TABS: ORAL_TABLET | ORAL | 5 refills | Status: DC

## 2020-07-25 NOTE — Progress Notes (Signed)
Pt present for follow up/discuss PCOS.

## 2020-07-25 NOTE — Patient Instructions (Signed)
Diet for Polycystic Ovary Syndrome Polycystic ovary syndrome (PCOS) is a common hormonal disorder that affects a woman's reproductive system. It can cause problems with menstrual periods and make it hard to get and stay pregnant. Changing what you eat can help your hormones reach normal levels, improve your health, and help you better managePCOS. Following a balanced diet can help you lose weight and improve the way that your body uses the hormone insulin to control blood sugar. This may include: Eating low-fat (lean) proteins, complex carbohydrates, fresh fruits and vegetables, low-fat dairy products, healthy fats, and fiber. Cutting down on calories. Exercising regularly. What are tips for following this plan? Follow a balanced diet for meals and snacks. Eat breakfast, lunch, dinner, and one or two snacks every day. Include protein in each meal and snack. Choose whole grains instead of products that are made with refined flour. Eat a variety of foods. Exercise regularly as told by your health care provider. Aim to do at least 30 minutes of exercise on most days of the week. If you are overweight or obese: Pay attention to how many calories you eat. Cutting down on calories can help you lose weight. Work with your health care provider or a dietitian to figure out how many calories you need each day. What foods should I eat?  Fruits Include a variety of colors and types. All fruits are helpful for PCOS. Vegetables Include a variety of colors and types. All vegetables are helpful for PCOS. Grains Whole grains, such as whole wheat. Whole-grain breads, crackers, cereals, and pasta. Unsweetened oatmeal. Bulgur, barley, quinoa, and brown rice. Tortillasmade from corn or whole-wheat flour. Meats and other proteins Lean proteins, such as fish, chicken, beans, eggs, and tofu. Dairy Low-fat dairy products, such as skim milk, cheese sticks, and yogurt. Beverages Low-fat or fat-free drinks, such as  water, low-fat milk, sugar-free drinks, andsmall amounts of 100% fruit juice. Seasonings and condiments Ketchup. Mustard. Barbecue sauce. Relish. Low-fat or fat-free mayonnaise. Fats and oils Olive oil or canola oil. Walnuts and almonds. The items listed above may not be a complete list of recommended foods and beverages. Contact a dietitian for more options. What foods should I avoid? Foods that are high in calories or fat, especially saturated or trans fats. Fried foods. Sweets. Products that are made from refined white flour, includingwhite bread, pastries, white rice, and pasta. The items listed above may not be a complete list of foods and beverages to avoid. Contact a dietitian for more information. Summary PCOS is a hormonal imbalance that affects a woman's reproductive system. It can cause problems with menstrual periods and make it hard to get and stay pregnant. You can help to manage your PCOS by exercising regularly and eating a healthy, varied diet of vegetables, fruit, whole grains, lean protein, and low-fat dairy products. Changing what you eat can improve the way that your body uses insulin, help your hormones reach normal levels, and help you lose weight. This information is not intended to replace advice given to you by your health care provider. Make sure you discuss any questions you have with your healthcare provider. Document Revised: 06/22/2019 Document Reviewed: 06/22/2019 Elsevier Patient Education  2022 [employer].

## 2020-07-25 NOTE — Progress Notes (Signed)
    GYNECOLOGY PROGRESS NOTE  Subjective:    Patient ID: Gina Green, female    DOB: April 04, 2000, 20 y.o.   MRN: [id number]  HPI  Patient is a 20 y.o. [id number] female who presents for discussion of her PCOS.  She reports that she was diagnosed with PCOS several years ago but is unsure what type she has.  Patient reports that she stopped her OCPs in March as she desires to conceive.  Notes that she still picks up her prescription so that her are not aware of her plans.  She has been checking her basal body temperature and cervical mucus.  Notes that she has not been a change signifying that she is ovulating.  Since discontinuing her OCP she has had 2 spontaneous cycles, both lasted 4 days and were light in flow.  No associated dysmenorrhea.  She is currently using Myoinositol vitamin supplement.  Her partner is age 59 as well, and has never fathered children.  Of note, patient reports that has a recent history of COVID infection from June 2 - 12.   The following portions of the patient's history were reviewed and updated as appropriate: allergies, current medications, past family history, past medical history, past social history, past surgical history, and problem list.  Review of Systems Pertinent items noted in HPI and remainder of comprehensive ROS otherwise negative.   Objective:   Blood pressure 107/70, pulse 82, height 5\' 2"  (1.575 m), weight 164 lb 1.6 oz (74.4 kg), last menstrual period 06/11/2020. Body mass index is 30.01 kg/m.  General appearance: Alert, no acute distress Remainder of exam deferred   Assessment:   PCOS  Plan:   Patient with a history of PCOS.  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  We will repeat labs today as her last labs were performed approximately 2 years ago.  I discussed with patient that if she was noted to not be experiencing ovulation then medications  could be utilized to assist with reproduction.  I also encourage patient to engage in healthy diet (PCOS diet) and physical exercise.  I discussed that decreasing her BMI would also be helpful in her attempts at conception and could help for this to occur naturally.  Lastly I discussed that with patient's recent history of COVID, her cycles have the potential for being altered for approximately 3 to 6 months.  Will return in a few months to see if cycles have changed.  I discussed timed coitus with patient.  RTC in 3-6 months.   A total of 15 minutes were spent face-to-face with the patient during this encounter and over half of that time involved counseling and coordination of care.  06/13/2020, MD [facility]

## 2020-07-28 ENCOUNTER — Encounter: Payer: Self-pay | Admitting: Obstetrics and Gynecology

## 2020-08-02 LAB — PCOS DIAGNOSTIC PROFILE
17-Alpha-Hydroxyprogesterone: 70 ng/dL
ANTI-MULLERIAN HORMONE (AMH): 16.9 ng/mL — ABNORMAL HIGH
DHEA-Sulfate, LCMS: 194 ug/dL
Estradiol, Serum, MS: 45 pg/mL
Follicle Stimulating Hormone: 5.6 m[IU]/mL
Free Testosterone, Serum: 5.2 pg/mL
Luteinizing Hormone (LH) ECL: 18 m[IU]/mL
Prolactin: 9.67 ng/mL
Sex Hormone Binding Globulin: 61.3 nmol/L
TSH: 1.6 uU/mL
Testosterone, Serum (Total): 74 ng/dL — ABNORMAL HIGH
Testosterone-% Free: 0.7 %

## 2020-08-02 LAB — HEMOGLOBIN A1C
Est. average glucose Bld gHb Est-mCnc: 100 mg/dL
Hgb A1c MFr Bld: 5.1 % (ref 4.8–5.6)

## 2020-08-05 MED ORDER — METFORMIN HCL ER 750 MG PO TB24: 750.0000 mg | ORAL_TABLET | Freq: Every day | ORAL | 3 refills | Status: DC

## 2020-09-19 ENCOUNTER — Other Ambulatory Visit: Payer: Self-pay | Admitting: Obstetrics and Gynecology

## 2020-10-22 ENCOUNTER — Encounter: Payer: Self-pay | Admitting: Obstetrics and Gynecology

## 2020-11-21 DIAGNOSIS — N911 Secondary amenorrhea: Secondary | ICD-10-CM

## 2020-11-21 MED ORDER — MEDROXYPROGESTERONE ACETATE 10 MG PO TABS: 10.0000 mg | ORAL_TABLET | Freq: Every day | ORAL | 2 refills | Status: DC

## 2020-12-09 NOTE — Progress Notes (Deleted)
GYNECOLOGY ANNUAL PHYSICAL EXAM PROGRESS NOTE  Subjective:    Gina Green is a 20 y.o. G25P0010 female who presents for an annual exam. The patient has no complaints today. The patient {is/is not/has never been:13135} sexually active. The patient participates in regular exercise: {yes/no/not asked:9010}. Has the patient ever been transfused or tattooed?: {yes/no/not asked:9010}. The patient reports that there {is/is not:9024} domestic violence in her life.    Menstrual History: Menarche age: *** No LMP recorded.     Gynecologic History:  Contraception: {method:5051} History of STI's:  Last Pap: ***. Results were: {norm/abn:16337}.  ***Denies/Notes h/o abnormal pap smears. Last mammogram: ***. Results were: {norm/abn:16337}       OB History  Gravida Para Term Preterm AB Living  1 0 0 0 1 0  SAB IAB Ectopic Multiple Live Births  1 0 0 0 0    # Outcome Date GA Lbr Len/2nd Weight Sex Delivery Anes PTL Lv  1 SAB 04/2018            Past Medical History:  Diagnosis Date   Amenorrhea    Anxiety    Anxiety state 05/31/2015   Depression    Fibromyalgia    Generalized anxiety disorder 05/15/2014   Hearing loss    Insomnia 05/15/2014   Migraine without aura and without status migrainosus, not intractable 05/15/2014   Moderate headache 05/31/2015   Nystagmus    Palpitations    Panic attack 05/15/2014   POTS (postural orthostatic tachycardia syndrome) 10/19/2016   Pre-syncope    Seizure-like activity (HCC) 05/31/2015   Tension headache 05/15/2014    Past Surgical History:  Procedure Laterality Date   NO PAST SURGERIES      Family History  Problem Relation Age of Onset   High Cholesterol Mother    Thyroid disease Mother    Depression Mother    Anxiety disorder Mother    Migraines Mother    Anxiety disorder Father    High Cholesterol Maternal Grandmother    Thyroid disease Maternal Grandmother    Lung cancer Maternal Grandmother    Cancer Maternal Grandfather         MOUTH   Cirrhosis Maternal Grandfather    Cancer Paternal Grandmother        SKIN   Heart disease Other        maternal great grandparents, both had MI & strokes    Social History   Socioeconomic History   Marital status: Single    Spouse name: Not on file   Number of children: 0   Years of education: 10   Highest education level: Not on file  Occupational History   Not on file  Tobacco Use   Smoking status: Never   Smokeless tobacco: Never  Vaping Use   Vaping Use: Former  Substance and Sexual Activity   Alcohol use: No    Alcohol/week: 0.0 standard drinks   Drug use: Not Currently    Types: Marijuana    Comment: pt denies even past use of drugs   Sexual activity: Yes    Birth control/protection: None  Other Topics Concern   Not on file  Social History Narrative    She has not been to school since October 2016. Her mother is considering home schooling her.   Living with mother, step-father,  step-sister .   Lives with boyfriend in [place] Part of the time.   Right handed   Drinks 1 cup of caffeine daily   Social Determinants of  Health   Financial Resource Strain: Not on file  Food Insecurity: Not on file  Transportation Needs: Not on file  Physical Activity: Not on file  Stress: Not on file  Social Connections: Not on file  Intimate Partner Violence: Not on file    Current Outpatient Medications on File Prior to Visit  Medication Sig Dispense Refill   ALPRAZolam (XANAX) 0.25 MG tablet Take 0.25 mg by mouth 2 (two) times daily.     Escitalopram Oxalate (LEXAPRO PO) Take by mouth. One tablet daily     medroxyPROGESTERone (PROVERA) 10 MG tablet Take 1 tablet (10 mg total) by mouth daily. Use for ten days 10 tablet 2   metFORMIN (GLUCOPHAGE XR) 750 MG 24 hr tablet Take 1 tablet (750 mg total) by mouth daily with breakfast. 90 tablet 3   topiramate (TOPAMAX) 25 MG tablet Take 1 tablet (25 mg total) by mouth 2 (two) times daily. 180 tablet 6   No current  facility-administered medications on file prior to visit.    Allergies  Allergen Reactions   Hpv 9-Valent Recomb Vaccine Hives   Other Other (See Comments)    Seasonal allergies cause itching, water eyes and nosebleeds.     Review of Systems Constitutional: negative for chills, fatigue, fevers and sweats Eyes: negative for irritation, redness and visual disturbance Ears, nose, mouth, throat, and face: negative for hearing loss, nasal congestion, snoring and tinnitus Respiratory: negative for asthma, cough, sputum Cardiovascular: negative for chest pain, dyspnea, exertional chest pressure/discomfort, irregular heart beat, palpitations and syncope Gastrointestinal: negative for abdominal pain, change in bowel habits, nausea and vomiting Genitourinary: negative for abnormal menstrual periods, genital lesions, sexual problems and vaginal discharge, dysuria and urinary incontinence Integument/breast: negative for breast lump, breast tenderness and nipple discharge Hematologic/lymphatic: negative for bleeding and easy bruising Musculoskeletal:negative for back pain and muscle weakness Neurological: negative for dizziness, headaches, vertigo and weakness Endocrine: negative for diabetic symptoms including polydipsia, polyuria and skin dryness Allergic/Immunologic: negative for hay fever and urticaria      Objective:  There were no vitals taken for this visit. There is no height or weight on file to calculate BMI.    General Appearance:    Alert, cooperative, no distress, appears stated age  Head:    Normocephalic, without obvious abnormality, atraumatic  Eyes:    PERRL, conjunctiva/corneas clear, EOM's intact, both eyes  Ears:    Normal external ear canals, both ears  Nose:   Nares normal, septum midline, mucosa normal, no drainage or sinus tenderness  Throat:   Lips, mucosa, and tongue normal; teeth and gums normal  Neck:   Supple, symmetrical, trachea midline, no adenopathy; thyroid:  no enlargement/tenderness/nodules; no carotid bruit or JVD  Back:     Symmetric, no curvature, ROM normal, no CVA tenderness  Lungs:     Clear to auscultation bilaterally, respirations unlabored  Chest Wall:    No tenderness or deformity   Heart:    Regular rate and rhythm, S1 and S2 normal, no murmur, rub or gallop  Breast Exam:    No tenderness, masses, or nipple abnormality  Abdomen:     Soft, non-tender, bowel sounds active all four quadrants, no masses, no organomegaly.    Genitalia:    Pelvic:external genitalia normal, vagina without lesions, discharge, or tenderness, rectovaginal septum  normal. Cervix normal in appearance, no cervical motion tenderness, no adnexal masses or tenderness.  Uterus normal size, shape, mobile, regular contours, nontender.  Rectal:    Normal external sphincter.  No hemorrhoids appreciated. Internal exam not done.   Extremities:   Extremities normal, atraumatic, no cyanosis or edema  Pulses:   2+ and symmetric all extremities  Skin:   Skin color, texture, turgor normal, no rashes or lesions  Lymph nodes:   Cervical, supraclavicular, and axillary nodes normal  Neurologic:   CNII-XII intact, normal strength, sensation and reflexes throughout   .  Labs:  Lab Results  Component Value Date   WBC 11.0 (H) 12/29/2018   HGB 15.0 12/29/2018   HCT 41.9 12/29/2018   MCV 85.2 12/29/2018   PLT 275 12/29/2018    Lab Results  Component Value Date   CREATININE 0.93 12/29/2018   BUN 9 12/29/2018   NA 136 12/29/2018   K 2.8 (L) 12/29/2018   CL 104 12/29/2018   CO2 20 (L) 12/29/2018    Lab Results  Component Value Date   ALT 17 12/29/2018   AST 22 12/29/2018   ALKPHOS 42 12/29/2018   BILITOT 0.7 12/29/2018    Lab Results  Component Value Date   TSH 1.320 07/12/2018     Assessment:   1. Encounter for well woman exam with routine gynecological exam   2. Pap smear for cervical cancer screening   3. PCOS (polycystic ovarian syndrome)   4. Anxiety and  depression      Plan:  Blood tests: CBC with diff and Comprehensive metabolic panel. Breast self exam technique reviewed and patient encouraged to perform self-exam monthly. Contraception: {contraceptive methods:5051}. Discussed healthy lifestyle modifications. Mammogram {discussed/ordered:14545} Pap smear ordered. COVID vaccination status: Follow up in 1 year for annual exam   Edwyna Shell, LPN [facility]

## 2020-12-09 NOTE — Patient Instructions (Incomplete)
Breast Self-Awareness °Breast self-awareness is knowing how your breasts look and feel. Doing breast self-awareness is important. It allows you to catch a breast problem early while it is still small and can be treated. All women should do breast self-awareness, including women who have had breast implants. Tell your doctor if you notice a change in your breasts. °What you need: °A mirror. °A well-lit room. °How to do a breast self-exam °A breast self-exam is one way to learn what is normal for your breasts and to check for changes. To do a breast self-exam: °Look for changes ° °Take off all the clothes above your waist. °Stand in front of a mirror in a room with good lighting. °Put your hands on your hips. °Push your hands down. °Look at your breasts and nipples in the mirror to see if one breast or nipple looks different from the other. Check to see if: °The shape of one breast is different. °The size of one breast is different. °There are wrinkles, dips, and bumps in one breast and not the other. °Look at each breast for changes in the skin, such as: °Redness. °Scaly areas. °Look for changes in your nipples, such as: °Liquid around the nipples. °Bleeding. °Dimpling. °Redness. °A change in where the nipples are. °Feel for changes ° °Lie on your back on the floor. °Feel each breast. To do this, follow these steps: °Pick a breast to feel. °Put the arm closest to that breast above your head. °Use your other arm to feel the nipple area of your breast. Feel the area with the pads of your three middle fingers by making small circles with your fingers. For the first circle, press lightly. For the second circle, press harder. For the third circle, press even harder. °Keep making circles with your fingers at the different pressures as you move down your breast. Stop when you feel your ribs. °Move your fingers a little toward the center of your body. °Start making circles with your fingers again, this time going up until  you reach your collarbone. °Keep making up-and-down circles until you reach your armpit. Remember to keep using the three pressures. °Feel the other breast in the same way. °Sit or stand in the tub or shower. °With soapy water on your skin, feel each breast the same way you did in step 2 when you were lying on the floor. °Write down what you find °Writing down what you find can help you remember what to tell your doctor. Write down: °What is normal for each breast. °Any changes you find in each breast, including: °The kind of changes you find. °Whether you have pain. °Size and location of any lumps. °When you last had your menstrual period. °General tips °Check your breasts every month. °If you are breastfeeding, the best time to check your breasts is after you feed your baby or after you use a breast pump. °If you get menstrual periods, the best time to check your breasts is 5-7 days after your menstrual period is over. °With time, you will become comfortable with the self-exam, and you will begin to know if there are changes in your breasts. °Contact a doctor if you: °See a change in the shape or size of your breasts or nipples. °See a change in the skin of your breast or nipples, such as red or scaly skin. °Have fluid coming from your nipples that is not normal. °Find a lump or thick area that was not there before. °Have pain in   your breasts. °Have any concerns about your breast health. °Summary °Breast self-awareness includes looking for changes in your breasts, as well as feeling for changes within your breasts. °Breast self-awareness should be done in front of a mirror in a well-lit room. °You should check your breasts every month. If you get menstrual periods, the best time to check your breasts is 5-7 days after your menstrual period is over. °Let your doctor know of any changes you see in your breasts, including changes in size, changes on the skin, pain or tenderness, or fluid from your nipples that is not  normal. °This information is not intended to replace advice given to you by your health care provider. Make sure you discuss any questions you have with your health care provider. °Document Revised: 08/31/2017 Document Reviewed: 08/31/2017 °Elsevier Patient Education © 2022 [employer] °Preventive Care 21-39 Years Old, Female °Preventive care refers to lifestyle choices and visits with your health care provider that can promote health and wellness. Preventive care visits are also called wellness exams. °What can I expect for my preventive care visit? °Counseling °During your preventive care visit, your health care provider may ask about your: °Medical history, including: °Past medical problems. °Family medical history. °Pregnancy history. °Current health, including: °Menstrual cycle. °Method of birth control. °Emotional well-being. °Home life and relationship well-being. °Sexual activity and sexual health. °Lifestyle, including: °Alcohol, nicotine or tobacco, and drug use. °Access to firearms. °Diet, exercise, and sleep habits. °Work and work environment. °Sunscreen use. °Safety issues such as seatbelt and bike helmet use. °Physical exam °Your health care provider may check your: °Height and weight. These may be used to calculate your BMI (body mass index). BMI is a measurement that tells if you are at a healthy weight. °Waist circumference. This measures the distance around your waistline. This measurement also tells if you are at a healthy weight and may help predict your risk of certain diseases, such as type 2 diabetes and high blood pressure. °Heart rate and blood pressure. °Body temperature. °Skin for abnormal spots. °What immunizations do I need? °Vaccines are usually given at various ages, according to a schedule. Your health care provider will recommend vaccines for you based on your age, medical history, and lifestyle or other factors, such as travel or where you work. °What tests do I  need? °Screening °Your health care provider may recommend screening tests for certain conditions. This may include: °Pelvic exam and Pap test. °Lipid and cholesterol levels. °Diabetes screening. This is done by checking your blood sugar (glucose) after you have not eaten for a while (fasting). °Hepatitis B test. °Hepatitis C test. °HIV (human immunodeficiency virus) test. °STI (sexually transmitted infection) testing, if you are at risk. °BRCA-related cancer screening. This may be done if you have a family history of breast, ovarian, tubal, or peritoneal cancers. °Talk with your health care provider about your test results, treatment options, and if necessary, the need for more tests. °Follow these instructions at home: °Eating and drinking ° °Eat a healthy diet that includes fresh fruits and vegetables, whole grains, lean protein, and low-fat dairy products. °Take vitamin and mineral supplements as recommended by your health care provider. °Do not drink alcohol if: °Your health care provider tells you not to drink. °You are pregnant, may be pregnant, or are planning to become pregnant. °If you drink alcohol: °Limit how much you have to 0-1 drink a day. °Know how much alcohol is in your drink. In the [place] one drink equals one 12 oz   bottle of beer (355 mL), one 5 oz glass of wine (148 mL), or one 1½ oz glass of hard liquor (44 mL). °Lifestyle °Brush your teeth every morning and night with fluoride toothpaste. Floss one time each day. °Exercise for at least 30 minutes 5 or more days each week. °Do not use any products that contain nicotine or tobacco. These products include cigarettes, chewing tobacco, and vaping devices, such as e-cigarettes. If you need help quitting, ask your health care provider. °Do not use drugs. °If you are sexually active, practice safe sex. Use a condom or other form of protection to prevent STIs. °If you do not wish to become pregnant, use a form of birth control. If you plan to become  pregnant, see your health care provider for a prepregnancy visit. °Find healthy ways to manage stress, such as: °Meditation, yoga, or listening to music. °Journaling. °Talking to a trusted person. °Spending time with friends and family. °Minimize exposure to UV radiation to reduce your risk of skin cancer. °Safety °Always wear your seat belt while driving or riding in a vehicle. °Do not drive: °If you have been drinking alcohol. Do not ride with someone who has been drinking. °If you have been using any mind-altering substances or drugs. °While texting. °When you are tired or distracted. °Wear a helmet and other protective equipment during sports activities. °If you have firearms in your house, make sure you follow all gun safety procedures. °Seek help if you have been physically or sexually abused. °What's next? °Go to your health care provider once a year for an annual wellness visit. °Ask your health care provider how often you should have your eyes and teeth checked. °Stay up to date on all vaccines. °This information is not intended to replace advice given to you by your health care provider. Make sure you discuss any questions you have with your health care provider. °Document Revised: 07/10/2020 Document Reviewed: 07/10/2020 °Elsevier Patient Education © 2022 Else[employer] Inc. ° °

## 2020-12-10 ENCOUNTER — Encounter: Payer: Self-pay | Admitting: Obstetrics and Gynecology

## 2020-12-10 ENCOUNTER — Encounter: Payer: Medicaid Other | Admitting: Obstetrics and Gynecology

## 2020-12-10 DIAGNOSIS — Z01419 Encounter for gynecological examination (general) (routine) without abnormal findings: Secondary | ICD-10-CM

## 2020-12-10 DIAGNOSIS — Z124 Encounter for screening for malignant neoplasm of cervix: Secondary | ICD-10-CM

## 2020-12-10 DIAGNOSIS — F32A Depression, unspecified: Secondary | ICD-10-CM

## 2020-12-10 DIAGNOSIS — E282 Polycystic ovarian syndrome: Secondary | ICD-10-CM

## 2021-04-19 ENCOUNTER — Encounter: Payer: Self-pay | Admitting: Obstetrics and Gynecology

## 2021-04-29 ENCOUNTER — Encounter: Payer: Self-pay | Admitting: Obstetrics and Gynecology

## 2021-05-05 NOTE — Progress Notes (Signed)
? ? ?  GYNECOLOGY PROGRESS NOTE ? ?Subjective:  ? ? Patient ID: Gina Green, female    DOB: 2000-12-22, 21 y.o.   MRN: [id number] ? ?HPI ? Patient is a 21 y.o. G33P0010 female who presents for fertility management.  Patient has a history of PCOS and irregular cycles.  Notes that she is now ready to begin Clomid for ovulation induction.  She does report that she tried the use of birth control for 2 months and in this past month was able to have a cycle and ovulate on her own.  Desires to have her progesterone level checked as she believes she has ovulated this month.  Notes that she has not ovulated in over a year.. ? ?The following portions of the patient's history were reviewed and updated as appropriate: allergies, current medications, past family history, past medical history, past social history, past surgical history, and problem list. ? ?Review of Systems ?Pertinent items noted in HPI and remainder of comprehensive ROS otherwise negative.  ? ?Objective:  ? Blood pressure 117/78, pulse 62, height 5\' 2"  (1.575 m), weight 193 lb 5 oz (87.7 kg).  Body mass index is 35.36 kg/m?. ?General appearance: alert and no distress ?Remainder of exam deferred. ? ?Assessment:  ? ?1. PCOS (polycystic ovarian syndrome)   ?2. Primary anovulatory infertility   ?  ? ?Plan:  ? ?Patient not attempting to regulate her cycles due to her PCOS.  Has had successful cycles after 2 rounds of contraception and now with a spontaneous cycle.  We will check patient's progesterone levels as she is at day 21 one of her current cycle.  ?Discussion had about the use of Clomid for ovulation induction.  Discussed risks and benefits.  After discussion patient okay to begin medication.  Advised to start with next menstrual cycle.  Prescribed Clomid 50 mg.  Patient also advised to restart her metformin as she has not been on this medication in several months.  We will likely assist with fertility issues.  Schedule follow-up in 3-4 months if no pregnancy  occurs. ? ?A total of 20 minutes were spent face-to-face with the patient during this encounter and over half of that time involved counseling and coordination of care. ? ? , MD ?[facility] ? ?

## 2021-05-06 ENCOUNTER — Ambulatory Visit (INDEPENDENT_AMBULATORY_CARE_PROVIDER_SITE_OTHER): Payer: Self-pay | Admitting: Obstetrics and Gynecology

## 2021-05-06 ENCOUNTER — Encounter: Payer: Self-pay | Admitting: Obstetrics and Gynecology

## 2021-05-06 VITALS — BP 117/78 | HR 62 | Ht 62.0 in | Wt 193.3 lb

## 2021-05-06 DIAGNOSIS — N970 Female infertility associated with anovulation: Secondary | ICD-10-CM

## 2021-05-06 DIAGNOSIS — E282 Polycystic ovarian syndrome: Secondary | ICD-10-CM

## 2021-05-06 MED ORDER — CLOMIPHENE CITRATE 50 MG PO TABS: 50.0000 mg | ORAL_TABLET | Freq: Every day | ORAL | 3 refills | Status: DC

## 2021-05-06 NOTE — Patient Instructions (Signed)
  CLOMID PATIENT INSTRUCTIONS  WHY USE IT? Clomid helps your ovaries to release eggs (ovulate).  HOW TO USE IT? Clomid is taken as a pill usually on days 5,6,7,8, & 9 of your cycle.  Day 1 is the first day of your period. The dose or duration may be changed to achieve ovulation.  Provera (progesterone) may first be used to bring on a period for some patients.  If you do not get pregnant this cycle, for your next cycles, take on days 1, 2, 3, 4 and 5.  If you do not get a period, take Provera 10 mg daily for 10 days to bring on a period; the first day you get bleeding is Day 1 of your cycle. The day of ovulation on Clomid is usually between cycle day 14 and 17.  Having sexual intercourse at least every other day between cycle day 13 and 18 will improve your chances of becoming pregnant during the Clomid cycle.  You may monitor your ovulation using basal body temperature charts or with ovulation kits.  If using the ovulation predictor kits, having intercourse the day of the surge and the two days following is recommended. If you get your period, call when it starts for an appointment with your doctor, so that an exam may be done, and another Clomid cycle can be considered if appropriate. If you do not get a period by day 35 of the cycle, please get a blood pregnancy test.  If it is negative, speak to your doctor for instructions to bring on another period and to plan a follow-up appointment.  THINGS TO KNOW: If you get pregnant while using Clomid, your chance of twins is 7% and triplets is less than 1%. Some studies have suggested the use of "fertility drugs" may increase your risk of ovarian cancers in the future.  It is unclear if these drugs increase the risk, or people who have problems with fertility are prone for these cancers.  If there is an actual risk, it is very low.  If you have a history of liver problems or ovarian cancer, it may be wise to avoid this medication.  SIDE EFFECTS: The most  common side effect is hot flashes (20%). Breast tenderness, headaches, nausea, bloating may also occur at different times. Less than 3/1,000 people have dryness or loss of hair. Persistent ovarian cysts may form from the use of this medication. Ovarian hyperstimulation syndrome is a rare side effect at low doses. Visual changes like flashes of light or blurring.      

## 2021-05-07 ENCOUNTER — Encounter: Payer: Self-pay | Admitting: Obstetrics and Gynecology

## 2021-05-07 LAB — PROGESTERONE: Progesterone: 0.1 ng/mL

## 2021-05-07 MED ORDER — MEDROXYPROGESTERONE ACETATE 10 MG PO TABS: 10.0000 mg | ORAL_TABLET | Freq: Every day | ORAL | 2 refills | Status: DC

## 2021-05-13 ENCOUNTER — Encounter: Payer: Medicaid Other | Admitting: Obstetrics and Gynecology

## 2021-05-21 ENCOUNTER — Encounter: Payer: Self-pay | Admitting: Obstetrics and Gynecology

## 2022-04-24 ENCOUNTER — Encounter: Payer: Self-pay | Admitting: Obstetrics and Gynecology

## 2022-05-09 ENCOUNTER — Emergency Department (HOSPITAL_BASED_OUTPATIENT_CLINIC_OR_DEPARTMENT_OTHER)
Admission: EM | Admit: 2022-05-09 | Discharge: 2022-05-09 | Disposition: A | Discharge status: Home / Self Care | Payer: Medicaid Other | Attending: Emergency Medicine | Admitting: Emergency Medicine

## 2022-05-09 ENCOUNTER — Ambulatory Visit
Admission: RE | Admit: 2022-05-09 | Discharge: 2022-05-09 | Disposition: A | Discharge status: Home / Self Care | Payer: Medicaid Other | Source: Ambulatory Visit

## 2022-05-09 ENCOUNTER — Emergency Department (HOSPITAL_BASED_OUTPATIENT_CLINIC_OR_DEPARTMENT_OTHER): Payer: Medicaid Other

## 2022-05-09 ENCOUNTER — Encounter (HOSPITAL_BASED_OUTPATIENT_CLINIC_OR_DEPARTMENT_OTHER): Payer: Self-pay | Admitting: Emergency Medicine

## 2022-05-09 ENCOUNTER — Ambulatory Visit: Admit: 2022-05-09 | Payer: Medicaid Other

## 2022-05-09 ENCOUNTER — Ambulatory Visit: Payer: Medicaid Other

## 2022-05-09 VITALS — BP 133/91 | HR 139 | Temp 99.0°F | Resp 18

## 2022-05-09 DIAGNOSIS — R0789 Other chest pain: Secondary | ICD-10-CM | POA: Insufficient documentation

## 2022-05-09 DIAGNOSIS — R2232 Localized swelling, mass and lump, left upper limb: Secondary | ICD-10-CM

## 2022-05-09 DIAGNOSIS — R6889 Other general symptoms and signs: Secondary | ICD-10-CM

## 2022-05-09 DIAGNOSIS — D179 Benign lipomatous neoplasm, unspecified: Secondary | ICD-10-CM | POA: Insufficient documentation

## 2022-05-09 DIAGNOSIS — D1722 Benign lipomatous neoplasm of skin and subcutaneous tissue of left arm: Secondary | ICD-10-CM

## 2022-05-09 HISTORY — DX: Bipolar disorder, unspecified: F31.9

## 2022-05-09 HISTORY — DX: Polycystic ovarian syndrome: E28.2

## 2022-05-09 LAB — T4, FREE: Free T4: 0.76 ng/dL (ref 0.61–1.12)

## 2022-05-09 LAB — COMPREHENSIVE METABOLIC PANEL
ALT: 15 U/L (ref 0–44)
AST: 17 U/L (ref 15–41)
Albumin: 5 g/dL (ref 3.5–5.0)
Alkaline Phosphatase: 74 U/L (ref 38–126)
Anion gap: 10 (ref 5–15)
BUN: 13 mg/dL (ref 6–20)
CO2: 27 mmol/L (ref 22–32)
Calcium: 10.5 mg/dL — ABNORMAL HIGH (ref 8.9–10.3)
Chloride: 102 mmol/L (ref 98–111)
Creatinine, Ser: 0.85 mg/dL (ref 0.44–1.00)
GFR, Estimated: >60 mL/min (ref >60)
Glucose, Bld: 84 mg/dL (ref 70–99)
Potassium: 4 mmol/L (ref 3.5–5.1)
Sodium: 139 mmol/L (ref 135–145)
Total Bilirubin: 0.5 mg/dL (ref 0.3–1.2)
Total Protein: 8 g/dL (ref 6.5–8.1)

## 2022-05-09 LAB — CBC WITH DIFFERENTIAL/PLATELET
Abs Immature Granulocytes: 0.02 10*3/uL (ref 0.00–0.07)
Basophils Absolute: 0 10*3/uL (ref 0.0–0.1)
Basophils Relative: 0 %
Eosinophils Absolute: 0 10*3/uL (ref 0.0–0.5)
Eosinophils Relative: 0 %
HCT: 45.6 % (ref 36.0–46.0)
Hemoglobin: 16.2 g/dL — ABNORMAL HIGH (ref 12.0–15.0)
Immature Granulocytes: 0 %
Lymphocytes Relative: 32 %
Lymphs Abs: 3.5 10*3/uL (ref 0.7–4.0)
MCH: 31.1 pg (ref 26.0–34.0)
MCHC: 35.5 g/dL (ref 30.0–36.0)
MCV: 87.5 fL (ref 80.0–100.0)
Monocytes Absolute: 0.7 10*3/uL (ref 0.1–1.0)
Monocytes Relative: 6 %
Neutro Abs: 6.6 10*3/uL (ref 1.7–7.7)
Neutrophils Relative %: 62 %
Platelets: 311 10*3/uL (ref 150–400)
RBC: 5.21 MIL/uL — ABNORMAL HIGH (ref 3.87–5.11)
RDW: 12.1 % (ref 11.5–15.5)
WBC: 10.9 10*3/uL — ABNORMAL HIGH (ref 4.0–10.5)
nRBC: 0 % (ref 0.0–0.2)

## 2022-05-09 LAB — PREGNANCY, URINE: Preg Test, Ur: NEGATIVE

## 2022-05-09 LAB — TSH: TSH: 3.433 u[IU]/mL (ref 0.350–4.500)

## 2022-05-09 MED ORDER — IOHEXOL 300 MG/ML  SOLN
100.0000 mL | Freq: Once | INTRAMUSCULAR | Status: AC | PRN
  Administered 2022-05-09: 75 mL via INTRAVENOUS

## 2022-05-09 NOTE — ED Triage Notes (Signed)
Pt states she noticed a lump to left axilla a few days ago, extending into left breast. Pt states she looked back at old photos and feels there was a difference in appearance of that area 1 yr ago, but never noticed at the time. States area is not tender to palpation, "but sometimes it will hurt from laying on it weird".

## 2022-05-09 NOTE — ED Provider Notes (Signed)
[place] [facility] AT [facility] Provider Note   CSN: [id number] Arrival date & time: 05/09/22  1738     History  No chief complaint on file.   Gina Green is a 22 y.o. female who presents emergency department with chief complaint of axillary mass.  Patient has noticed a growth under her left axilla that has been progressively worsening.  She is unsure of the length of time that that has been going on but she has been having pressure sensation in that area and complains of pain and sometimes numbness and tingling that goes down into her fingers.  She feels like it extends into the chest wall and breast.  Patient also having other sensations that she is concerned could be due to Hashimoto's thyroiditis which her mother has including hot and cold, night sweats, unexplained weight gain, brain fog and difficulty thinking.  She does not currently have a primary care physician because she lost her Medicaid when she turned 17.  She denies any shortness of breath, unexplained weight loss or fevers.  She has no nipple discharge or changes in the skin on her breasts.  HPI     Home Medications Prior to Admission medications   Medication Sig Start Date End Date Taking? Authorizing Provider  ALPRAZolam (XANAX) 0.25 MG tablet Take 0.25 mg by mouth 2 (two) times daily.    [provider]  ARIPIPRAZOLE PO Take by mouth.    [provider]  ESCITALOPRAM OXALATE PO Take by mouth.    [provider]      Allergies    Hpv 9-valent recomb vaccine and Other    Review of Systems   Review of Systems  Physical Exam Updated Vital Signs BP 123/82   Pulse 96   Temp 98.4 F (36.9 C)   Resp 18   SpO2 98%  Physical Exam Vitals and nursing note reviewed.  Constitutional:      General: She is not in acute distress.    Appearance: She is well-developed. She is not diaphoretic.  HENT:     Head: Normocephalic and atraumatic.     Right Ear: External  ear normal.     Left Ear: External ear normal.     Nose: Nose normal.     Mouth/Throat:     Mouth: Mucous membranes are moist.  Eyes:     General: No scleral icterus.    Conjunctiva/sclera: Conjunctivae normal.  Cardiovascular:     Rate and Rhythm: Normal rate and regular rhythm.     Heart sounds: Normal heart sounds. No murmur heard.    No friction rub. No gallop.  Pulmonary:     Effort: Pulmonary effort is normal. No respiratory distress.     Breath sounds: Normal breath sounds.  Chest:  Breasts:    Right: Normal.     Left: Normal. No swelling, bleeding, inverted nipple, nipple discharge or skin change.     Comments: Well-circumscribed mass in the left axilla which feels mobile and fatty in consistency.  It is nontender. Abdominal:     General: Bowel sounds are normal. There is no distension.     Palpations: Abdomen is soft. There is no mass.     Tenderness: There is no abdominal tenderness. There is no guarding.  Musculoskeletal:     Cervical back: Normal range of motion.  Skin:    General: Skin is warm and dry.  Neurological:     Mental Status: She is alert and oriented to person,  place, and time.  Psychiatric:        Behavior: Behavior normal.     ED Results / Procedures / Treatments   Labs (all labs ordered are listed, but only abnormal results are displayed) Labs Reviewed  COMPREHENSIVE METABOLIC PANEL - Abnormal; Notable for the following components:      Result Value   Calcium 10.5 (*)    All other components within normal limits  CBC WITH DIFFERENTIAL/PLATELET - Abnormal; Notable for the following components:   WBC 10.9 (*)    RBC 5.21 (*)    Hemoglobin 16.2 (*)    All other components within normal limits  PREGNANCY, URINE  TSH  T4, FREE    EKG None  Radiology CT Chest W Contrast  Result Date: 05/09/2022 CLINICAL DATA:  Chest wall mass (Ped 0-17y) Mass in the Left Axilla- please make windows wide enough to encompass axillae. 3 or 4 days noticed a  growth under her left axilla . No recent illness or fevers. Lots of pain/fatigue.no bug bites. No history of surgical procedures EXAM: CT CHEST WITH CONTRAST TECHNIQUE: Multidetector CT imaging of the chest was performed during intravenous contrast administration. RADIATION DOSE REDUCTION: This exam was performed according to the departmental dose-optimization program which includes automated exposure control, adjustment of the mA and/or kV according to patient size and/or use of iterative reconstruction technique. CONTRAST:  75mL OMNIPAQUE IOHEXOL 300 MG/ML  SOLN COMPARISON:  Chest x-ray 01/11/2017 FINDINGS: Cardiovascular: Normal heart size. No significant pericardial effusion. The thoracic aorta is normal in caliber. No atherosclerotic plaque of the thoracic aorta. No coronary artery calcifications. Mediastinum/Nodes: No enlarged mediastinal, hilar, or axillary lymph nodes. Thyroid gland, trachea, and esophagus demonstrate no significant findings. Tiny hiatal hernia. Lungs/Pleura: No focal consolidation. No pulmonary nodule. No pulmonary mass. No pleural effusion. No pneumothorax. Upper Abdomen: No acute abnormality. Musculoskeletal: No chest wall abnormality. No suspicious lytic or blastic osseous lesions. No acute displaced fracture. IMPRESSION: 1. Tiny hiatal hernia. 2. No acute intrathoracic abnormality. 3. No mass identified. Electronically Signed   By: Tish Frederickson M.D.   On: 05/09/2022 20:53    Procedures Procedures    Medications Ordered in ED Medications  iohexol (OMNIPAQUE) 300 MG/ML solution 100 mL (75 mLs Intravenous Contrast Given 05/09/22 2036)    ED Course/ Medical Decision Making/ A&P                             Medical Decision Making Patient here with complaints of axillary mass.  I ordered labs including CMP, CBC urine pregnancy thyroid panels all of which showed no significant abnormalities.  She does have slight elevation in her hemoglobin of insignificant value.  I ordered,  visualized and independently interpreted CT chest with contrast.  There does appear to be slight swelling in the left axilla but it is consistent with the surrounding tissue in the area that appears to be fatty and I suspect this is a lipoma.  Patient has no other acute findings today.  She does not have any evidence of abscess.  She has no weakness in the extremities.  Will have the patient follow-up in the outpatient setting with primary care and surgery for elective excision.  Discussed return precautions.  Amount and/or Complexity of Data Reviewed Labs: ordered. Radiology: ordered and independent interpretation performed.  Risk Prescription drug management.           Final Clinical Impression(s) / ED Diagnoses Final diagnoses:  Lipoma of  left axilla  Multiple complaints    Rx / DC Orders ED Discharge Orders     None         Arthor Captain, PA-C 05/09/22 2351    Loetta Rough, MD 05/12/22 [phone]

## 2022-05-09 NOTE — Discharge Instructions (Signed)
Follow up with the Surgeon regarding your lipoma. Read the attached information. Get help right away for any new or worsening condition.

## 2022-05-09 NOTE — ED Triage Notes (Signed)
3 or 4 days noticed a growth under her left axilla . No recent illness or fevers. Lots of pain/fatigue.no bug bites . For past few months has been having some painin her left shoulder *thought maybe I tore my rotator cuff.: states she feels like brain foggy.

## 2022-05-09 NOTE — Discharge Instructions (Addendum)
I do not believe that the area on your left arm is sinister nature, growing aggressively or requires urgent attention.    Please follow-up with your OB/GYN at your previously scheduled appointment this coming Tuesday to see if they recommend further evaluation.  Thank you for visiting urgent care today.

## 2022-05-09 NOTE — ED Provider Notes (Signed)
[facility]    CSN: [id number] Arrival date & time: 05/09/22  1456    HISTORY   Chief Complaint  Patient presents with   Mass   HPI Gina Green is a pleasant, 22 y.o. female who presents to urgent care today. Pt states she noticed a lump in her left axilla a few days ago, extending into left breast. Pt states she looked back at old photos and feels there was a difference in appearance of that area 1 yr ago, but never noticed at the time. States area is not tender to palpation, "but sometimes it will hurt from laying on it weird".    Past Medical History:  Diagnosis Date   Amenorrhea    Anxiety    Anxiety state 05/31/2015   Bipolar disorder    Depression    Fibromyalgia    Generalized anxiety disorder 05/15/2014   Hearing loss    Insomnia 05/15/2014   Migraine without aura and without status migrainosus, not intractable 05/15/2014   Moderate headache 05/31/2015   Nystagmus    Palpitations    Panic attack 05/15/2014   PCOS (polycystic ovarian syndrome)    POTS (postural orthostatic tachycardia syndrome) 10/19/2016   Pre-syncope    Seizure-like activity 05/31/2015   Tension headache 05/15/2014   Patient Active Problem List   Diagnosis Date Noted   Multiple neurological symptoms 02/05/2017   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   Cannabis abuse 02/05/2017   POTS (postural orthostatic tachycardia syndrome) 10/19/2016   Palpitations 07/31/2016   Moderate headache 05/31/2015   Seizure-like activity 05/31/2015   Anxiety state 05/31/2015   Tension headache 05/15/2014   Generalized anxiety disorder 05/15/2014   Panic attack 05/15/2014   Insomnia 05/15/2014   Migraine without aura and without status migrainosus, not intractable 05/15/2014   Past Surgical History:  Procedure Laterality Date   NO PAST SURGERIES     OB History     Gravida  1   Para      Term      Preterm      AB  1   Living         SAB  1   IAB      Ectopic      Multiple       Live Births             Home Medications    Prior to Admission medications   Medication Sig Start Date End Date Taking? Authorizing Provider  ALPRAZolam (XANAX) 0.25 MG tablet Take 0.25 mg by mouth 2 (two) times daily.    [provider]  clomiPHENE (CLOMID) 50 MG tablet Take 1 tablet (50 mg total) by mouth daily. Take on days 5-9 of your period 05/06/21   Hildred Laser, MD  medroxyPROGESTERone (PROVERA) 10 MG tablet Take 1 tablet (10 mg total) by mouth daily. Use for ten days 05/07/21   Hildred Laser, MD    Family History Family History  Problem Relation Age of Onset   High Cholesterol Mother    Thyroid disease Mother    Depression Mother    Anxiety disorder Mother    Migraines Mother    Anxiety disorder Father    High Cholesterol Maternal Grandmother    Thyroid disease Maternal Grandmother    Lung cancer Maternal Grandmother    Cancer Maternal Grandfather        MOUTH   Cirrhosis Maternal Grandfather    Cancer Paternal Grandmother        SKIN  Heart disease Other        maternal great grandparents, both had MI & strokes   Social History Social History   Tobacco Use   Smoking status: Never   Smokeless tobacco: Never  Vaping Use   Vaping Use: Former  Substance Use Topics   Alcohol use: No    Alcohol/week: 0.0 standard drinks of alcohol   Drug use: Never   Allergies   Hpv 9-valent recomb vaccine and Other  Review of Systems Review of Systems Pertinent findings revealed after performing a 14 point review of systems has been noted in the history of present illness.  Physical Exam Vital Signs BP (!) 133/91 (BP Location: Right Arm)   Pulse (!) 139 Comment: pt states normal for her with hx POTS  Temp 99 F (37.2 C) (Oral)   Resp 18   SpO2 98%   No data found.  Physical Exam Vitals and nursing note reviewed.  Constitutional:      General: She is not in acute distress.    Appearance: Normal appearance.  HENT:     Head: Normocephalic and  atraumatic.  Eyes:     Pupils: Pupils are equal, round, and reactive to light.  Cardiovascular:     Rate and Rhythm: Normal rate and regular rhythm.  Pulmonary:     Effort: Pulmonary effort is normal.     Breath sounds: Normal breath sounds.  Chest:    Musculoskeletal:        General: Normal range of motion.     Cervical back: Normal range of motion and neck supple.  Skin:    General: Skin is warm and dry.  Neurological:     General: No focal deficit present.     Mental Status: She is alert and oriented to person, place, and time. Mental status is at baseline.  Psychiatric:        Mood and Affect: Mood normal.        Behavior: Behavior normal.        Thought Content: Thought content normal.        Judgment: Judgment normal.     Visual Acuity Right Eye Distance:   Left Eye Distance:   Bilateral Distance:    Right Eye Near:   Left Eye Near:    Bilateral Near:     UC Couse / Diagnostics / Procedures:     Radiology No results found.  Procedures Procedures (including critical care time) EKG  Pending results:  Labs Reviewed - No data to display  Medications Ordered in UC: Medications - No data to display  UC Diagnoses / Final Clinical Impressions(s)   I have reviewed the triage vital signs and the nursing notes.  Pertinent labs & imaging results that were available during my care of the patient were reviewed by me and considered in my medical decision making (see chart for details).    Final diagnoses:  Mass of left axilla   Patient advised that physical exam is not concerning for sinister or aggressive growth.  Recommend that she keep her already scheduled appointment with her OB/GYN 2 days from now and discuss whether or not imaging might be of use.   Please see discharge instructions below for details of plan of care as provided to patient. ED Prescriptions   None    PDMP not reviewed this encounter.  Pending results:  Labs Reviewed - No data to  display  Discharge Instructions:   Discharge Instructions      I do  not believe that the area on your left arm is sinister nature, growing aggressively or requires urgent attention.    Please follow-up with your OB/GYN at your previously scheduled appointment this coming Tuesday to see if they recommend further evaluation.  Thank you for visiting urgent care today.      Disposition Upon Discharge:  Condition: stable for discharge home  Patient presented with an acute illness with associated systemic symptoms and significant discomfort requiring urgent management. In my opinion, this is a condition that a prudent lay person (someone who possesses an average knowledge of health and medicine) may potentially expect to result in complications if not addressed urgently such as respiratory distress, impairment of bodily function or dysfunction of bodily organs.   Routine symptom specific, illness specific and/or disease specific instructions were discussed with the patient and/or caregiver at length.   As such, the patient has been evaluated and assessed, work-up was performed and treatment was provided in alignment with urgent care protocols and evidence based medicine.  Patient/parent/caregiver has been advised that the patient may require follow up for further testing and treatment if the symptoms continue in spite of treatment, as clinically indicated and appropriate.  Patient/parent/caregiver has been advised to return to the [facility] or PCP if no better; to PCP or the Emergency Department if new signs and symptoms develop, or if the current signs or symptoms continue to change or worsen for further workup, evaluation and treatment as clinically indicated and appropriate  The patient will follow up with their current PCP if and as advised. If the patient does not currently have a PCP we will assist them in obtaining one.   The patient may need specialty follow up if the symptoms continue, in  spite of conservative treatment and management, for further workup, evaluation, consultation and treatment as clinically indicated and appropriate.  Patient/parent/caregiver verbalized understanding and agreement of plan as discussed.  All questions were addressed during visit.  Please see discharge instructions below for further details of plan.  This office note has been dictated using Teaching laboratory technician.  Unfortunately, this method of dictation can sometimes lead to typographical or grammatical errors.  I apologize for your inconvenience in advance if this occurs.  Please do not hesitate to reach out to me if clarification is needed.      Theadora Rama Scales, PA-C 05/09/22 1525

## 2022-05-09 NOTE — ED Triage Notes (Signed)
Pts mom has Hashimoto's . Pt states she also has had recent weight gain.

## 2022-05-11 NOTE — Progress Notes (Signed)
GYNECOLOGY PROGRESS NOTE  Subjective:    Patient ID: Gina Green, female    DOB: 2000/06/05, 22 y.o.   MRN: [id number]  HPI  Patient is a 22 y.o. G72P0010 female who presents for lump or mass in her axillary region.  States that she noticed it several days ago.  Feels like it has been there for at least a year (reports looking over old photos and saw a bulge in her axillary region at that time as well). Feels that it may be pressing on a nerve, sometimes hurts when moving her shoulder. Was seen in the ER 3 days ago for this complaint. Notes having a CT scan performed which was negative, was told she likely had a lipoma.  Patient notes concern that nothing was seen, desires to have further evaluation with additional imaging.   Patient also complaining of symptoms of fatigue, brain fog, and confusion. Denies any changes in medications, stressors. Does have a history of PCOS, POTS, fibromyalgia, and mood disorder. Is unsure if the mass has anything to do with this (wonders if it could be malignant), or if there is something else going on.  Patient notes that she is no longer taking her Provera to manage her cycles. Last menses was ~ 4 months ago.   Lastly, patient reports seeing her lab results from her recent ER visit. Unsure of what they mean but notes that there was protein in her urine. Would like her urine retested as she is worried about this value.   The following portions of the patient's history were reviewed and updated as appropriate: allergies, current medications, past family history, past medical history, past social history, past surgical history, and problem list.  Review of Systems Pertinent items noted in HPI and remainder of comprehensive ROS otherwise negative.   Objective:   Blood pressure 118/72, pulse 89, resp. rate 16, height  (1.575 m), weight 213 lb 12.8 oz (97 kg). Body mass index is 39.1 kg/m. General appearance: alert and no distress Breasts: appear normal,  no suspicious masses, no skin or nipple changes or axillary nodes. Well-circumscribed mass in the left axilla which feels mobile and fatty in consistency, 3-4 cm. It is nontender.     Labs:  Admission on 05/09/2022, Discharged on 05/09/2022  Component Date Value Ref Range Status   Sodium 05/09/2022 139  135 - 145 mmol/L Final   Potassium 05/09/2022 4.0  3.5 - 5.1 mmol/L Final   Chloride 05/09/2022 102  98 - 111 mmol/L Final   CO2 05/09/2022 27  22 - 32 mmol/L Final   Glucose, Bld 05/09/2022 84  70 - 99 mg/dL Final   Glucose reference range applies only to samples taken after fasting for at least 8 hours.   BUN 05/09/2022 13  6 - 20 mg/dL Final   Creatinine, Ser 05/09/2022 0.85  0.44 - 1.00 mg/dL Final   Calcium 14/78/2956 10.5 (H)  8.9 - 10.3 mg/dL Final   Total Protein 21/30/8657 8.0  6.5 - 8.1 g/dL Final   Albumin 84/69/6295 5.0  3.5 - 5.0 g/dL Final   AST 28/41/3244 17  15 - 41 U/L Final   ALT 05/09/2022 15  0 - 44 U/L Final   Alkaline Phosphatase 05/09/2022 74  38 - 126 U/L Final   Total Bilirubin 05/09/2022 0.5  0.3 - 1.2 mg/dL Final   GFR, Estimated 05/09/2022 >60  >60 mL/min Final   Comment: (NOTE) Calculated using the CKD-EPI Creatinine Equation (2021)  Anion gap 05/09/2022 10  5 - 15 Final   Performed at [facility], [address] [place], [place] [address]   WBC 05/09/2022 10.9 (H)  4.0 - 10.5 K/uL Final   RBC 05/09/2022 5.21 (H)  3.87 - 5.11 MIL/uL Final   Hemoglobin 05/09/2022 16.2 (H)  12.0 - 15.0 g/dL Final   HCT 60/45/4098 45.6  36.0 - 46.0 % Final   MCV 05/09/2022 87.5  80.0 - 100.0 fL Final   MCH 05/09/2022 31.1  26.0 - 34.0 pg Final   MCHC 05/09/2022 35.5  30.0 - 36.0 g/dL Final   RDW 11/91/4782 12.1  11.5 - 15.5 % Final   Platelets 05/09/2022 311  150 - 400 K/uL Final   nRBC 05/09/2022 0.0  0.0 - 0.2 % Final   Neutrophils Relative % 05/09/2022 62  % Final   Neutro Abs 05/09/2022 6.6  1.7 - 7.7 K/uL Final   Lymphocytes Relative  05/09/2022 32  % Final   Lymphs Abs 05/09/2022 3.5  0.7 - 4.0 K/uL Final   Monocytes Relative 05/09/2022 6  % Final   Monocytes Absolute 05/09/2022 0.7  0.1 - 1.0 K/uL Final   Eosinophils Relative 05/09/2022 0  % Final   Eosinophils Absolute 05/09/2022 0.0  0.0 - 0.5 K/uL Final   Basophils Relative 05/09/2022 0  % Final   Basophils Absolute 05/09/2022 0.0  0.0 - 0.1 K/uL Final   Immature Granulocytes 05/09/2022 0  % Final   Abs Immature Granulocytes 05/09/2022 0.02  0.00 - 0.07 K/uL Final   Performed at [facility], [address] [place], [place] [address]   Preg Test, Ur 05/09/2022 NEGATIVE  NEGATIVE Final   Comment:        THE SENSITIVITY OF THIS METHODOLOGY IS >20 mIU/mL. Performed at [facility], [address] [place], [place] [address]    TSH 05/09/2022 3.433  0.350 - 4.500 uIU/mL Final   Comment: Performed by a 3rd Generation assay with a functional sensitivity of <=0.01 uIU/mL. Performed at [facility], [address] [place], [place] [address]    Free T4 05/09/2022 0.76  0.61 - 1.12 ng/dL Final   Comment: (NOTE) Biotin ingestion may interfere with free T4 tests. If the results are inconsistent with the TSH level, previous test results, or the clinical presentation, then consider biotin interference. If needed, order repeat testing after stopping biotin. Performed at [facility], [address], [place], [place] [address]     Imaging:  CT Chest W Contrast CLINICAL DATA:  Chest wall mass (Ped 0-17y) Mass in the Left Axilla- please make windows wide enough to encompass axillae. 3 or 4 days noticed a growth under her left axilla . No recent illness or fevers. Lots of pain/fatigue.no bug bites. No history of surgical procedures  EXAM: CT CHEST WITH CONTRAST  TECHNIQUE: Multidetector CT imaging of the chest was performed during intravenous contrast administration.  RADIATION DOSE REDUCTION:  This exam was performed according to the departmental dose-optimization program which includes automated exposure control, adjustment of the mA and/or kV according to patient size and/or use of iterative reconstruction technique.  CONTRAST:  77mL OMNIPAQUE IOHEXOL 300 MG/ML  SOLN  COMPARISON:  Chest x-ray 01/11/2017  FINDINGS: Cardiovascular: Normal heart size. No significant pericardial effusion. The thoracic aorta is normal in caliber. No atherosclerotic plaque of the thoracic aorta. No coronary artery calcifications.  Mediastinum/Nodes: No enlarged mediastinal, hilar, or axillary lymph nodes. Thyroid gland, trachea, and esophagus demonstrate no  significant findings. Tiny hiatal hernia.  Lungs/Pleura: No focal consolidation. No pulmonary nodule. No pulmonary mass. No pleural effusion. No pneumothorax.  Upper Abdomen: No acute abnormality.  Musculoskeletal:  No chest wall abnormality.  No suspicious lytic or blastic osseous lesions. No acute displaced fracture.  IMPRESSION: 1. Tiny hiatal hernia. 2. No acute intrathoracic abnormality. 3. No mass identified.  Electronically Signed   By: Tish Frederickson M.D.   On: 05/09/2022 20:53    Assessment:   1. Axillary mass, left   2. Fatigue, unspecified type   3. Brain fog   4. Cloudy urine      Plan:   1. Axillary mass, left - Likely lipoma in left axillary region, not visualized on CT scan. Patient desires further imaging. Will order ultrasound.  - [place] LIMITED ULTRASOUND INCLUDING AXILLA LEFT BREAST ; Future  2. Fatigue, unspecified type - Vitamin D (25 hydroxy) - B12 - Folate - Magnesium - Reviewed all other labs from recent ER visit with patient. Normal TSH and electrolytes.   3. Brain fog - Unclear cause. Could be due to vitamin deficiency, or hormonal imbalance.   4. Cloudy urine - Patient desires to have urine rechecked for protein or other abnormalities.  - POCT Urinalysis Dipstick    Hildred Laser, MD [facility]

## 2022-05-12 ENCOUNTER — Ambulatory Visit (INDEPENDENT_AMBULATORY_CARE_PROVIDER_SITE_OTHER): Payer: Medicaid Other | Admitting: Obstetrics and Gynecology

## 2022-05-12 ENCOUNTER — Encounter: Payer: Self-pay | Admitting: Obstetrics and Gynecology

## 2022-05-12 VITALS — BP 118/72 | HR 89 | Resp 16 | Ht 62.0 in | Wt 213.8 lb

## 2022-05-12 DIAGNOSIS — R2232 Localized swelling, mass and lump, left upper limb: Secondary | ICD-10-CM | POA: Diagnosis not present

## 2022-05-12 DIAGNOSIS — R4189 Other symptoms and signs involving cognitive functions and awareness: Secondary | ICD-10-CM

## 2022-05-12 DIAGNOSIS — R5383 Other fatigue: Secondary | ICD-10-CM

## 2022-05-12 DIAGNOSIS — R8290 Unspecified abnormal findings in urine: Secondary | ICD-10-CM

## 2022-05-13 ENCOUNTER — Encounter: Payer: Self-pay | Admitting: Obstetrics and Gynecology

## 2022-05-13 ENCOUNTER — Other Ambulatory Visit: Payer: Self-pay | Admitting: Obstetrics and Gynecology

## 2022-05-13 LAB — VITAMIN B12: Vitamin B-12: 630 pg/mL (ref 232–1245)

## 2022-05-13 LAB — MAGNESIUM: Magnesium: 2 mg/dL (ref 1.6–2.3)

## 2022-05-13 LAB — FOLATE: Folate: 9.5 ng/mL (ref >3.0)

## 2022-05-13 LAB — VITAMIN D 25 HYDROXY (VIT D DEFICIENCY, FRACTURES): Vit D, 25-Hydroxy: 19.1 ng/mL — ABNORMAL LOW (ref 30.0–100.0)

## 2022-05-13 MED ORDER — VITAMIN D (ERGOCALCIFEROL) 1.25 MG (50000 UNIT) PO CAPS: 50000.0000 [IU] | ORAL_CAPSULE | ORAL | 0 refills | Status: DC

## 2022-05-26 ENCOUNTER — Ambulatory Visit
Admission: RE | Admit: 2022-05-26 | Discharge: 2022-05-26 | Disposition: A | Discharge status: Home / Self Care | Payer: Medicaid Other | Source: Ambulatory Visit | Attending: Obstetrics and Gynecology | Admitting: Obstetrics and Gynecology

## 2022-05-26 ENCOUNTER — Other Ambulatory Visit: Payer: Self-pay | Admitting: Obstetrics and Gynecology

## 2022-05-26 DIAGNOSIS — R2232 Localized swelling, mass and lump, left upper limb: Secondary | ICD-10-CM

## 2022-05-26 DIAGNOSIS — R5383 Other fatigue: Secondary | ICD-10-CM

## 2022-05-26 DIAGNOSIS — R4189 Other symptoms and signs involving cognitive functions and awareness: Secondary | ICD-10-CM

## 2022-05-26 DIAGNOSIS — R8290 Unspecified abnormal findings in urine: Secondary | ICD-10-CM

## 2022-06-01 NOTE — Progress Notes (Deleted)
   GYNECOLOGY CLINIC PROGRESS NOTE Subjective:     Gina Green is a 22 y.o. female here for discussion regarding weight loss. She has noted a weight gain of approximately *** pounds over the last {1-10:13787} {time; units:10300::"years"}. She feels ideal weight is *** pounds. Weight at graduation from high school was *** pounds. History of eating disorders: {eating disorders:12511::"none"}. There is a family history positive for obesity in the {family:120002}. Previous treatments for obesity include {obesity treatment:12513}. Obesity associated medical conditions: {diagnoses:12512}. Obesity associated medications: {obesity assoc meds:1214::"none"}. Cardiovascular risk factors besides obesity: {risks factors:510}.  The following portions of the patient's history were reviewed and updated as appropriate: She  has a past medical history of Amenorrhea, Anxiety, Anxiety state (05/31/2015), Bipolar disorder (HCC), Depression, Fibromyalgia, Generalized anxiety disorder (05/15/2014), Hearing loss, Insomnia (05/15/2014), Migraine without aura and without status migrainosus, not intractable (05/15/2014), Moderate headache (05/31/2015), Nystagmus, Palpitations, Panic attack (05/15/2014), PCOS (polycystic ovarian syndrome), POTS (postural orthostatic tachycardia syndrome) (10/19/2016), Pre-syncope, Seizure-like activity (HCC) (05/31/2015), and Tension headache (05/15/2014). Her family history includes Anxiety disorder in her father and mother; Cancer in her maternal grandfather and paternal grandmother; Cirrhosis in her maternal grandfather; Depression in her mother; Heart disease in an other family member; High Cholesterol in her maternal grandmother and mother; Kidney disease in her mother; Lung cancer in her maternal grandmother; Migraines in her mother; Rheum arthritis in her mother; Thyroid disease in her maternal grandmother and mother. Current Outpatient Medications on File Prior to Visit  Medication Sig  Dispense Refill   Vitamin D, Ergocalciferol, (DRISDOL) 1.25 MG (50000 UNIT) CAPS capsule Take 1 capsule (50,000 Units total) by mouth every 7 (seven) days. 8 capsule 0   ALPRAZolam (XANAX) 0.25 MG tablet Take 0.25 mg by mouth 2 (two) times daily.     ARIPIPRAZOLE PO Take by mouth.     escitalopram (LEXAPRO) 10 MG tablet Take 10 mg by mouth at bedtime.     No current facility-administered medications on file prior to visit.   She is allergic to hpv 9-valent recomb vaccine and other..  Review of Systems Pertinent items noted in HPI and remainder of comprehensive ROS otherwise negative.   Objective:   There were no vitals taken for this visit. There is no height or weight on file to calculate BMI. General appearance: {general exam:16600} Lungs: {lung exam:16931} Heart: {heart exam:5510} Abdomen: {abdominal exam:16834}.  Waist circumference *** inches.  Extremities: {extremity exam:5109} Neurologic: {neuro exam:17854}   Assessment:   Obesity, Class ***. I assessed Khiara to be in {stages:16440} stage with respect to weight loss.   Plan:   {GNFA:21308}    Hildred Laser, MD [facility]

## 2022-06-02 ENCOUNTER — Ambulatory Visit: Payer: Medicaid Other | Admitting: Obstetrics and Gynecology

## 2022-06-18 ENCOUNTER — Ambulatory Visit (INDEPENDENT_AMBULATORY_CARE_PROVIDER_SITE_OTHER): Payer: Medicaid Other | Admitting: Obstetrics and Gynecology

## 2022-06-18 ENCOUNTER — Encounter: Payer: Self-pay | Admitting: Obstetrics and Gynecology

## 2022-06-18 VITALS — BP 119/76 | HR 87 | Ht 62.0 in | Wt 216.9 lb

## 2022-06-18 DIAGNOSIS — E282 Polycystic ovarian syndrome: Secondary | ICD-10-CM | POA: Diagnosis not present

## 2022-06-18 DIAGNOSIS — G90A Postural orthostatic tachycardia syndrome (POTS): Secondary | ICD-10-CM

## 2022-06-18 DIAGNOSIS — E6609 Other obesity due to excess calories: Secondary | ICD-10-CM | POA: Diagnosis not present

## 2022-06-18 DIAGNOSIS — Z6839 Body mass index (BMI) 39.0-39.9, adult: Secondary | ICD-10-CM

## 2022-06-18 DIAGNOSIS — Z713 Dietary counseling and surveillance: Secondary | ICD-10-CM | POA: Diagnosis not present

## 2022-06-18 MED ORDER — PHENTERMINE HCL 15 MG PO CAPS
15.0000 mg | ORAL_CAPSULE | ORAL | 0 refills | Status: DC
Start: 2022-06-18 — End: 2022-07-16

## 2022-06-18 MED ORDER — TOPIRAMATE 50 MG PO TABS: 50.0000 mg | ORAL_TABLET | Freq: Every day | ORAL | 3 refills | Status: AC

## 2022-06-18 NOTE — Patient Instructions (Addendum)
WEIGHT LOSS MEDICATION OPTIONS  Phentermine-Topiramate extended release (Qsymia) is the most effective weight loss drug available to date. It combines an adrenergic agonist with a neurostabilizer. Adults with migraines and obesity are good candidates for this weight loss medication. This medication is a tablet, and the dose gets titrated up every few weeks.  Side effects include: abnormal sensations, dizziness, taste alterations, insomnia, constipation, and dry mouth. If more than 5% weight loss is not achieved after 12 weeks of the maximum dose, the weight loss pill should be gradually discontinued.  You previously tried Phentermine at a different dose, this one combines Phentermine with another medication to allow for longer use with slower but more manageable weight loss. You can be on this medication for > 1 year or longer.    Bupropion/Naltrexone (Contrave) combines a dopamine/norepinephrine reuptake inhibitor and an opioid receptor antagonist. This medication is a tablet, and the dose gets titrated up every few weeks. It controls cravings and addicted behaviors related to food. Side effects include: constipation, headaches, insomnia, and dry mouth.  You can be on this medication for > 1 year or longer.    Wegovy and Ozempic are newer medication, once-weekly injectable prescriptions. Works well for patient with weight-associated comorbidities such as diabetes or high cholesterol. Each contains semaglutide (GLP-1). Common side effects may include: nausea, diarrhea, vomiting, constipation, stomach (abdomen) pain, headache, tiredness (fatigue), upset stomach, dizziness, feeling bloated, belching, gas, stomach flu, and heartburn.  There is a high demand for this particular medication so may be a slight delay in receiving it. You can be on this medication for > 1 year or longer.   Saxenda is an injection 3 mg is an injectable prescription medicine used once daily. Works well for patient with  weight-associated comorbidities such as diabetes or high cholesterol. Saxenda works like GLP-1 by regulating your appetite, which can lead to eating fewer calories and losing weight. The most common side effects of Saxenda in adults include nausea, diarrhea, constipation, vomiting, injection site reaction, low blood sugar (hypoglycemia), headache, tiredness (fatigue), dizziness, stomach pain, and change in enzyme (lipase) levels in your blood.   

## 2022-06-18 NOTE — Progress Notes (Signed)
GYNECOLOGY CLINIC PROGRESS NOTE Subjective:     Gina Green is a 22 y.o. female here for discussion regarding weight loss. She has noted a weight gain of approximately 30-60 pounds over the last 1 years. She feels ideal weight is 120-130 pounds. Weight at graduation from high school was 120 pounds. History of eating disorders: anorexia nervosa and binge eating disorder. There is a family history positive for obesity in the mother and brother. Previous treatments for obesity include nutritionist consultation as a child, and Metformin for PCOS management. Obesity associated medical conditions:  PCOS . Obesity associated medications: none.  Cardiovascular risk factors besides obesity: obesity (BMI >= 30 kg/m2) and sedentary lifestyle.  Currently is trying to monitor portions, however has issues with not eating during the day to binge-eating in the evening. Feels fairly active at work. Has cut down on sodas, maybe 1-3 a day depending on the day, but overall tries to   The following portions of the patient's history were reviewed and updated as appropriate:   She  has a past medical history of Amenorrhea, Anxiety, Anxiety state (05/31/2015), Bipolar disorder (HCC), Depression, Fibromyalgia, Generalized anxiety disorder (05/15/2014), Hearing loss, Insomnia (05/15/2014), Migraine without aura and without status migrainosus, not intractable (05/15/2014), Moderate headache (05/31/2015), Nystagmus, Palpitations, Panic attack (05/15/2014), PCOS (polycystic ovarian syndrome), POTS (postural orthostatic tachycardia syndrome) (10/19/2016), Pre-syncope, Seizure-like activity (HCC) (05/31/2015), and Tension headache (05/15/2014).  Her family history includes Anxiety disorder in her father and mother; Cancer in her maternal grandfather and paternal grandmother; Cirrhosis in her maternal grandfather; Depression in her mother; Heart disease in an other family member; High Cholesterol in her maternal grandmother and  mother; Kidney disease in her mother; Lung cancer in her maternal grandmother; Migraines in her mother; Rheum arthritis in her mother; Thyroid disease in her maternal grandmother and mother.   Current Outpatient Medications on File Prior to Visit  Medication Sig Dispense Refill   ALPRAZolam (XANAX) 0.25 MG tablet Take 0.25 mg by mouth 2 (two) times daily.     ARIPIPRAZOLE PO Take by mouth.     escitalopram (LEXAPRO) 10 MG tablet Take 10 mg by mouth at bedtime.     Vitamin D, Ergocalciferol, (DRISDOL) 1.25 MG (50000 UNIT) CAPS capsule Take 1 capsule (50,000 Units total) by mouth every 7 (seven) days. 8 capsule 0   No current facility-administered medications on file prior to visit.   She is allergic to hpv 9-valent recomb vaccine and other..  Review of Systems Pertinent items noted in HPI and remainder of comprehensive ROS otherwise negative.   Objective:   Blood pressure 119/76, pulse 87, height 5\' 2"  (1.575 m), weight 216 lb 14.4 oz (98.4 kg), last menstrual period 05/24/2022. Body mass index is 39.67 kg/m. General appearance: alert, cooperative, and no distress Lungs: clear to auscultation bilaterally Heart: regular rate and rhythm, S1, S2 normal, no murmur, click, rub or gallop Remainder of exam deferred.    Labs:  Office Visit on 05/12/2022  Component Date Value Ref Range Status   Vit D, 25-Hydroxy 05/12/2022 19.1 (L)  30.0 - 100.0 ng/mL Final   Comment: Vitamin D deficiency has been defined by the Institute of Medicine and an Endocrine Society practice guideline as a level of serum 25-OH vitamin D less than 20 ng/mL (1,2). The Endocrine Society went on to further define vitamin D insufficiency as a level between 21 and 29 ng/mL (2). 1. IOM (Institute of Medicine). 2010. Dietary reference    intakes for calcium and D. [place]  DC: The    [employer]. 2. Holick MF, Binkley [place], Bischoff-Ferrari HA, et al.    Evaluation, treatment, and prevention of vitamin D     deficiency: an Endocrine Society clinical practice    guideline. JCEM. 2011 Jul; 96(7):1911-30.    Vitamin B-12 05/12/2022 630  232 - 1,245 pg/mL Final   Folate 05/12/2022 9.5  >3.0 ng/mL Final   Comment: A serum folate concentration of less than 3.1 ng/mL is considered to represent clinical deficiency.    Magnesium 05/12/2022 2.0  1.6 - 2.3 mg/dL Final     Assessment:   Obesity, Class III. I assessed Gina Green to be in an action stage with respect to weight loss. PCOS H/o POTS Fatigue  Plan:   - General weight loss/lifestyle modification strategies discussed (elicit support from others; identify saboteurs; non-food rewards, etc). - Diet interventions: referral to dietitian for guidance in these changes and high protein/low carb diet.. - Informal exercise measures discussed, e.g. taking stairs instead of elevator. - Regular aerobic exercise program discussed. - Medication: The risks and benefits and side effects of medication, such as Adipex (Phenteramine),  Belviq (lorcarsin), Contrave (buproprion/naltrexone), Qsymia (phentermine/topiramate), and Saxenda (liraglutide) were discussed. The pros and cons of suppressing appetite and boosting metabolism is discussed. Risks of tolerence and addiction is discussed for selected agents discussed. Based on current weight loss barriers and insurance limitations, will decide to use Phentermine. Use of medicine will ne short term, such as 3-4 months at a time followed by a period of time off of the medicine to avoid these risks and side effects for Adipex discussed. Pt to call with any negative side effects and agrees to keep follow up appts. Will start with low dose of Phentermine due to POTS.  Also will prescribe Topamax for craving suppression.  - Fatigue noted also at last visit, underwent workup, noted to be Vitamin D deficient. Encourage supplementation with prescription dose Vitamin D..   Follow up in: 1 month.    A total of 38 minutes were  spent face-to-face with the patient during this encounter and over half of that time involved counseling and coordination of care.   Hildred Laser, MD [facility]

## 2022-07-05 ENCOUNTER — Other Ambulatory Visit: Payer: Self-pay | Admitting: Obstetrics and Gynecology

## 2022-07-16 ENCOUNTER — Other Ambulatory Visit: Payer: Self-pay | Admitting: Obstetrics and Gynecology

## 2022-07-16 MED ORDER — PHENTERMINE HCL 30 MG PO CAPS
30.0000 mg | ORAL_CAPSULE | ORAL | 0 refills | Status: DC
Start: 2022-07-16 — End: 2022-09-09

## 2022-07-16 NOTE — Telephone Encounter (Signed)
Patient should have had a 1 month follow up visit with me but does not look like one was scheduled.  Please have patient scheduled after I return.    Also, would not be wise to take this medication in the evening time due to risk of causing insomnia. If she feels a higher dose is warranted, I can change her dose to 30 mg daily

## 2022-08-16 ENCOUNTER — Other Ambulatory Visit: Payer: Self-pay | Admitting: Obstetrics and Gynecology

## 2022-09-09 ENCOUNTER — Encounter: Payer: Self-pay | Admitting: Obstetrics and Gynecology

## 2022-09-09 ENCOUNTER — Other Ambulatory Visit: Payer: Self-pay | Admitting: Obstetrics and Gynecology

## 2022-09-09 ENCOUNTER — Ambulatory Visit (INDEPENDENT_AMBULATORY_CARE_PROVIDER_SITE_OTHER): Payer: Medicaid Other | Admitting: Obstetrics and Gynecology

## 2022-09-09 VITALS — BP 126/84 | HR 99 | Resp 16 | Ht 62.0 in | Wt 203.2 lb

## 2022-09-09 DIAGNOSIS — E669 Obesity, unspecified: Secondary | ICD-10-CM | POA: Diagnosis not present

## 2022-09-09 DIAGNOSIS — E6609 Other obesity due to excess calories: Secondary | ICD-10-CM

## 2022-09-09 DIAGNOSIS — Z7689 Persons encountering health services in other specified circumstances: Secondary | ICD-10-CM

## 2022-09-09 DIAGNOSIS — Z6837 Body mass index (BMI) 37.0-37.9, adult: Secondary | ICD-10-CM | POA: Diagnosis not present

## 2022-09-09 DIAGNOSIS — N911 Secondary amenorrhea: Secondary | ICD-10-CM | POA: Diagnosis not present

## 2022-09-09 DIAGNOSIS — G90A Postural orthostatic tachycardia syndrome (POTS): Secondary | ICD-10-CM | POA: Diagnosis not present

## 2022-09-09 MED ORDER — PHENTERMINE HCL 30 MG PO CAPS: 30.0000 mg | ORAL_CAPSULE | ORAL | 1 refills | Status: DC

## 2022-09-09 MED ORDER — MEDROXYPROGESTERONE ACETATE 10 MG PO TABS: 10.0000 mg | ORAL_TABLET | Freq: Every day | ORAL | 2 refills | Status: DC

## 2022-09-09 NOTE — Patient Instructions (Signed)
Phentermine Capsules or Tablets What is this medication? PHENTERMINE (FEN ter meen) promotes weight loss. It works by decreasing appetite. It is often used for a short period of time. Changes to diet and exercise are often combined with this medication. This medicine may be used for other purposes; ask your health care provider or pharmacist if you have questions. COMMON BRAND NAME(S): Adipex-P, Atti-Plex P, Atti-Plex P Spansule, Fastin, Lomaira, Pro-Fast, Pro-Fast HS, Pro-Fast SA, Tara-8 What should I tell my care team before I take this medication? They need to know if you have any of these conditions: Agitation or nervousness Diabetes Glaucoma Heart disease High blood pressure History of substance use disorder History of stroke Kidney disease Lung disease called Primary Pulmonary Hypertension (PPH) Taken an MAOI, such as Carbex, Eldepryl, Marplan, Nardil, or Parnate in last 14 days Taking stimulant medications for attention disorders, weight loss, or to stay awake Thyroid disease An unusual or allergic reaction to phentermine, other medications, foods, dyes, or preservatives Pregnant or trying to get pregnant Breastfeeding How should I use this medication? Take this medication by mouth with a glass of water. Follow the directions on the prescription label. Take your medication at regular intervals. Do not take it more often than directed. Do not stop taking except on your care team's advice. Talk to your care team about the use of this medication in children. While this medication may be prescribed for children 17 years or older for selected conditions, precautions do apply. Overdosage: If you think you have taken too much of this medicine contact a poison control center or emergency room at once. NOTE: This medicine is only for you. Do not share this medicine with others. What if I miss a dose? If you miss a dose, take it as soon as you can. If it is almost time for your next dose,  take only that dose. Do not take double or extra doses. What may interact with this medication? Do not take this medication with any of the following: MAOIs, such as Carbex, Eldepryl, Marplan, Nardil, and Parnate This medication may also interact with the following: Alcohol Certain medications for depression, anxiety, or other mental health conditions Certain medications for blood pressure Linezolid Medications for colds or breathing difficulties, such as pseudoephedrine or phenylephrine Medications for diabetes Sibutramine Stimulant medications for ADHD, weight loss, or staying awake This list may not describe all possible interactions. Give your health care provider a list of all the medicines, herbs, non-prescription drugs, or dietary supplements you use. Also tell them if you smoke, drink alcohol, or use illegal drugs. Some items may interact with your medicine. What should I watch for while using this medication? Visit your care team for regular checks on your progress. Do not stop taking except on your care team's advice. You may develop a severe reaction. Your care team will tell you how much medication to take. Do not take this medication close to bedtime. It may prevent you from sleeping. This medication may affect your coordination, reaction time, or judgment. Do not drive or operate machinery until you know how this medication affects you. Sit up or stand slowly to reduce the risk of dizzy or fainting spells. Drinking alcohol with this medication can increase the risk of these side effects. This medication may affect blood sugar levels. Ask your care team if changes in diet or medications are needed if you have diabetes. Inform your care team if you wish to become pregnant or think you might be pregnant. Losing  weight while pregnant is not advised and may cause harm to the unborn child. Talk to your care team for more information. What side effects may I notice from receiving this  medication? Side effects that you should report to your care team as soon as possible: Allergic reactions--skin rash, itching, hives, swelling of the face, lips, tongue, or throat Heart valve disease--shortness of breath, chest pain, unusual weakness or fatigue, dizziness, feeling faint or lightheaded, fever, sudden weight gain, fast or irregular heartbeat Pulmonary hypertension--shortness of breath, chest pain, fast or irregular heartbeat, feeling faint or lightheaded, fatigue, swelling of the ankles or feet Side effects that usually do not require medical attention (report to your care team if they continue or are bothersome): Change in taste Diarrhea Dizziness Dry mouth Restlessness Trouble sleeping This list may not describe all possible side effects. Call your doctor for medical advice about side effects. You may report side effects to FDA at 1-800-FDA-1088. Where should I keep my medication? Keep out of the reach of children. This medication can be abused. Keep your medication in a safe place to protect it from theft. Do not share this medication with anyone. Selling or giving away this medication is dangerous and against the law. This medication may cause harm and death if it is taken by other adults, children, or pets. Return medication that has not been used to an official disposal site. Contact the DEA at (780) 061-2681 or your city/county government to find a site. If you cannot return the medication, mix any unused medication with a substance like cat litter or coffee grounds. Then throw the medication away in a sealed container like a sealed bag or coffee can with a lid. Do not use the medication after the expiration date. Store at room temperature between 20 and 25 degrees C (68 and 77 degrees F). Keep container tightly closed. NOTE: This sheet is a summary. It may not cover all possible information. If you have questions about this medicine, talk to your doctor, pharmacist, or health  care provider.  2024 Elsevier/Gold Standard (2021-07-23 00:00:00) Preventing Consequences of Unhealthy Weight Loss Behaviors, Adult Reaching and maintaining a healthy weight is important for your overall health. A healthy weight will vary from person to person. It is natural to want to lose weight quickly, using whatever methods seem fastest. However, losing weight in a healthy way is not a quick process. Instead, aim for slow, steady weight loss by making small changes and setting achievable goals. How can unhealthy weight loss behaviors affect me? Using unhealthy behaviors to try to lose weight can cause: Tiredness (fatigue), low heart rate, and low blood pressure. Imbalances in your body. These may be imbalances in: Electrolytes. These are salts and minerals in your blood. Chemicals. These are needed so your body can work properly. Body fluids. Loss of fluids may lead to dehydration. Organ damage or organ failure, especially affecting the kidneys. Thin bones that break easily. Loneliness or relationship problems with your friends and family. Emotional problems, including depression and anxiety. Changing unhealthy weight loss behaviors through lifestyle changes will improve your overall health. Maintaining a healthy weight also lowers your risk of certain conditions, such as: Obesity. Heart disease, high cholesterol, and high blood pressure. Type 2 diabetes. Breathing and sleeping disorders. Stroke. Osteoarthritis. This affects your joints. Osteoporosis. This affects your bones. Some cancers. What can increase my risk? Certain views or feelings about yourself and certain habits can increase your risk of unhealthy weight loss behaviors. These include: Having depression  and being overweight as an adult. Attempting weight loss as a child or teen. Using alcohol, drugs, or tobacco products. What actions can I take to prevent these behaviors? You can make certain lifestyle changes to help  you lose weight in a healthy way. These include eating nutritious foods and exercising regularly. Nutrition  Eat a variety of healthy foods, including fruits and vegetables, whole grains, lean proteins, and low-fat dairy products. Drink water instead of sugary drinks such as juice, soda, or sports drinks. Drink enough fluid to keep your urine pale yellow. Plan healthy, balanced meals. Work with a Data processing manager to make a healthy meal plan that works for you. Limit the following: Foods that are high in fat, salt (sodium), or sugar. These include candy, donuts, pizza, and fast foods. Fried or heavily processed foods. Lifestyle Avoid these unhealthy eating habits: Following a diet that restricts entire types of food. This may be a popular diet that promises extreme results in a short time. Skipping meals to save calories. Not eating anything for long periods of time (fasting). Restricting your calories to far fewer than the number that you need to lose or maintain a healthy weight. Taking laxative pills to make you have more frequent bowel movements. Taking medicines to make your body lose excess fluids (diuretics). Eating an excessive amount of food and then making yourself vomit. This is known as bingeing and purging. Do not use any products that contain nicotine or tobacco. These products include cigarettes, chewing tobacco, and vaping devices, such as e-cigarettes. If you need help quitting, ask your health care provider. Alcohol use Do not drink alcohol if: Your health care provider tells you not to drink. You are pregnant, may be pregnant, or are planning to become pregnant. If you drink alcohol: Limit how much you have to: 0-1 drink a day for women. 0-2 drinks a day for men. Know how much alcohol is in your drink. In the [place] one drink equals one 12 oz bottle of beer (355 mL), one 5 oz glass of wine (148 mL), or one 1 oz glass of hard liquor (44 mL). Activity  Avoid compulsively  getting an extreme amount of exercise. Work with a Data processing manager to make a healthy exercise program. Include different types of exercise in your exercise program, such as strengthening, aerobic, and flexibility exercises. To maintain your weight, get at least 150 minutes of moderate-intensity exercise each week. Moderate-intensity exercise could be brisk walking or biking. To lose a healthy amount of weight, get 60 minutes of moderate-intensity exercise each day. Find ways to reduce stress, such as regular exercise or meditation. Find a hobby or other activity that you enjoy to distract you from eating when you feel stressed or bored. Where to find support For more support, talk with: Your health care provider or dietitian. Ask about support groups. A mental health care provider. Family and friends. Where to find more information Learn more about how to prevent complications from unhealthy weight loss behaviors from: Centers for Disease Control and Prevention: [employer]Exhibition.com.br [employer] of Mental Health: http://www.maynard.net/ National Eating Disorders Association: www.nationaleatingdisorders.org Contact a health care provider if: You often feel very tired. You notice changes in your skin or your hair. You faint because of dehydration or too much exercise. You struggle to change your unhealthy weight loss behaviors on your own. Unhealthy weight loss behaviors are affecting your daily life or your relationships. You have signs or symptoms of an eating disorder. You have major weight changes in a short  period of time. You feel guilty or ashamed about eating or exercising. Summary Using unhealthy eating behaviors to try to lose weight can cause a variety of physical and emotional problems that affect your overall health and well-being. Aim for slow, steady weight loss by choosing healthy foods, avoiding unhealthy eating habits, and exercising regularly. Contact your health care provider if you  struggle to change your behaviors on your own or if you think that you may have an eating disorder. This information is not intended to replace advice given to you by your health care provider. Make sure you discuss any questions you have with your health care provider. Document Revised: 08/27/2020 Document Reviewed: 08/27/2020 Elsevier Patient Education  2024 [employer].

## 2022-09-09 NOTE — Progress Notes (Signed)
    GYNECOLOGY PROGRESS NOTE  Subjective:    Patient ID: Gina Green, female    DOB: 01-11-2001, 22 y.o.   MRN: [id number]  HPI  Patient is a 22 y.o. female who presents for 3 month weight management follow up. She has a past history of obesity, PCOS, POTS. She initiated use of Phentermine and Topamax 2 months ago.  Denies any undesirable side effects and reports compliance with medications. Has been off medication for a month as she ran out of her medication. Starting weight was 216 lbs.  Notes she got down to 196 lbs, however after running out of the medicine has regained a few lbs.     Patient also requesting something to initiate her cycles. Is currently on Day 49 without a cycle. Has a history of irregular menses. Is requesting Provera and/or Letrozole.  Current interventions:  1. Diet - She is doing a high protein diet. Grilled chicken. Trying to cut back on sugars and carbohydrates. She is drinking more protein shakes 2. Activity - Trying to walk more often. 5,000-8,000 steps per day 3. Reports bowel movements are normal.    The following portions of the patient's history were reviewed and updated as appropriate: allergies, current medications, past family history, past medical history, past social history, past surgical history, and problem list.  Review of Systems Pertinent items are noted in HPI.   Objective:       06/18/2022    2:52 PM 05/12/2022    9:00 AM 05/09/2022    8:51 PM  Vitals with BMI  Height 5\' 2"  5\' 2"    Weight 216 lbs 14 oz 213 lbs 13 oz   BMI 39.66 39.09   Systolic 119 118 782  Diastolic 76 72 82  Pulse 87 89 96    General appearance: alert, cooperative, and no distress Abdomen: soft, non-tender.  Waist circumference 41.5 in.    Labs:  No new labs Assessment:   Weight management Obesity, Body mass index is 37.17 kg/m. PCOS POTS  Plan:   Weight management  - was doing well with weight loss, can continue current management.  Refill given  on Phentermine. Also taking Topiramate for craving suppression.  History of PCOS, anovulatory cycles. Can prescribe Provera, advised against use of Letrozole while using Phentermine due to concerns of pregnancy while on medication.   POTS, currently not experiencing any major side effects from the medication that can affect this  condition.   RTC in 2 months.  Hildred Laser, MD [facility]

## 2022-09-22 ENCOUNTER — Encounter: Payer: Self-pay | Admitting: Obstetrics and Gynecology

## 2022-09-22 DIAGNOSIS — E282 Polycystic ovarian syndrome: Secondary | ICD-10-CM

## 2022-11-01 ENCOUNTER — Other Ambulatory Visit: Payer: Self-pay | Admitting: Obstetrics and Gynecology

## 2022-12-04 ENCOUNTER — Encounter: Payer: Self-pay | Admitting: Obstetrics and Gynecology

## 2022-12-04 ENCOUNTER — Ambulatory Visit (INDEPENDENT_AMBULATORY_CARE_PROVIDER_SITE_OTHER): Payer: Medicaid Other | Admitting: Obstetrics and Gynecology

## 2022-12-04 VITALS — BP 114/69 | HR 89 | Ht 62.0 in | Wt 190.0 lb

## 2022-12-04 DIAGNOSIS — Z713 Dietary counseling and surveillance: Secondary | ICD-10-CM | POA: Diagnosis not present

## 2022-12-04 DIAGNOSIS — Z7689 Persons encountering health services in other specified circumstances: Secondary | ICD-10-CM

## 2022-12-04 DIAGNOSIS — Z6834 Body mass index (BMI) 34.0-34.9, adult: Secondary | ICD-10-CM | POA: Diagnosis not present

## 2022-12-04 DIAGNOSIS — E669 Obesity, unspecified: Secondary | ICD-10-CM

## 2022-12-04 MED ORDER — PHENTERMINE HCL 37.5 MG PO CAPS: 37.5000 mg | ORAL_CAPSULE | ORAL | 2 refills | Status: DC

## 2022-12-04 NOTE — Progress Notes (Signed)
    GYNECOLOGY PROGRESS NOTE  Subjective:    Patient ID: [name], female    DOB: 2000/11/17, 22 y.o.   MRN: [id number]  HPI  Patient is a 22 y.o. female who presents for female who presents for 3 month weight management follow up. She has a past history of obesity, PCOS, POTS. She initiated use of Phentermine and Topamax 5 months ago. Denies any undesirable side effects and reports compliance with medications. Starting weight was 216 lbs    Current interventions:  1. Diet - increasing protein, decreasing carbohydrates and sugar.  Does still note evening cravings. Has difficulty modifying portions.  2. Activity - light walks 3. Reports bowel movements are normal.    The following portions of the patient's history were reviewed and updated as appropriate: allergies, current medications, past family history, past medical history, past social history, past surgical history, and problem list.  Review of Systems Pertinent items noted in HPI and remainder of comprehensive ROS otherwise negative.   Objective:       12/04/2022    3:15 PM 09/09/2022    2:01 PM 06/18/2022    2:52 PM  Vitals with BMI  Height 5\' 2"  5\' 2"  5\' 2"   Weight 190 lbs 203 lbs 3 oz 216 lbs 14 oz  BMI 34.74 37.16 39.66  Systolic 114 126 409  Diastolic 69 84 76  Pulse 89 99 87    General appearance: alert and no distress Abdomen: soft, non-tender.  Waist circumference 41 in.    Labs:  No new labs  Assessment:   Weight management Obesity, Body mass index is 34.75 kg/m.  Plan:   Weight management  - doing fairly well with weight loss, can continue current management.  Discussed increasing Topamax dosing of 50 mg to twice daily to better help curb evening cravings. Also, will increase Phentermine dose from 30 mg to 37.5 mg to better suppress appetite.  Discussed further dietary modifications that can be made, including meal planning, preparation of smaller portions with smaller plates and utensils,  consuming a glass of water prior to meals, continue to modify the carbohydrate intake, and increasing physical activity.   Folllow up in 6-8 weeks for weight check.    A total of 15 minutes were spent face-to-face with the patient during this encounter and over half of that time dealt with counseling and coordination of care.   Hildred Laser, MD [facility]

## 2022-12-04 NOTE — Patient Instructions (Addendum)
-   Increase Topamax to 1 tab (50 mg) to twice a day.  - Increase Phentermine dosing from 30 mg to 37.5 mg.

## 2023-01-12 NOTE — Progress Notes (Deleted)
    GYNECOLOGY PROGRESS NOTE  Subjective:    Patient ID: Gina Green, female    DOB: 2000/08/18, 22 y.o.   MRN: [id number]  HPI  Patient is a 22 y.o. female who presents for 4 month weight management follow up. She has a past history of obesity, PCOS, POTS. She initiated use of Phentermine and Topamax 6 months ago. Denies any undesirable side effects and reports compliance with medications. Starting weight was 216 lbs      Current interventions:  1. Diet -  2. Activity -  3. Reports bowel movements are ***.    {Common ambulatory SmartLinks:19316}  Review of Systems {ros; complete:30496}   Objective:       12/04/2022    3:15 PM 09/09/2022    2:01 PM 06/18/2022    2:52 PM  Vitals with BMI  Height 5\' 2"  5\' 2"  5\' 2"   Weight 190 lbs 203 lbs 3 oz 216 lbs 14 oz  BMI 34.74 37.16 39.66  Systolic 114 126 324  Diastolic 69 84 76  Pulse 89 99 87    General appearance: {general exam:16600} Abdomen: soft, non-tender.  Waist circumference *** in.    Labs:   Assessment:   Weight management Obesity, There is no height or weight on file to calculate BMI.  Plan:   Weight management  - doing well with weight loss, can continue current management.      Hildred Laser, MD [facility]

## 2023-01-13 ENCOUNTER — Ambulatory Visit: Payer: Medicaid Other | Admitting: Obstetrics and Gynecology

## 2023-05-06 NOTE — Progress Notes (Unsigned)
    GYNECOLOGY PROGRESS NOTE  Subjective:    Patient ID: [name], female    DOB: 31-Mar-2000, 23 y.o.   MRN: [id number]  HPI  Patient is a 23 y.o. female who presents for {NUMBER 1-10:22536} month weight management follow up. She has a past history of obesity, ***. She initiated use of *** {NUMBER 1-10:22536} months ago.  Denies any undesirable side effects and reports compliance with medications.    Current interventions:  1. Diet -  2. Activity -  3. Reports bowel movements are ***.    {Common ambulatory SmartLinks:19316}  Review of Systems {ros; complete:30496}   Objective:       12/04/2022    3:15 PM 09/09/2022    2:01 PM 06/18/2022    2:52 PM  Vitals with BMI  Height 5\' 2"  5\' 2"  5\' 2"   Weight 190 lbs 203 lbs 3 oz 216 lbs 14 oz  BMI 34.74 37.16 39.66  Systolic 114 126 629  Diastolic 69 84 76  Pulse 89 99 87    General appearance: {general exam:16600} Abdomen: soft, non-tender.  Waist circumference *** in.    Labs:   Assessment:   Weight management Obesity, There is no height or weight on file to calculate BMI.  Plan:   Weight management  - doing well with weight loss, can continue current management.      Hildred Laser, MD [facility]

## 2023-05-07 ENCOUNTER — Ambulatory Visit: Admitting: Obstetrics and Gynecology

## 2023-05-07 ENCOUNTER — Encounter: Payer: Self-pay | Admitting: Obstetrics and Gynecology

## 2023-05-07 VITALS — BP 109/72 | HR 81 | Resp 16 | Ht 62.0 in | Wt 188.6 lb

## 2023-05-07 DIAGNOSIS — Z713 Dietary counseling and surveillance: Secondary | ICD-10-CM | POA: Diagnosis not present

## 2023-05-07 DIAGNOSIS — L918 Other hypertrophic disorders of the skin: Secondary | ICD-10-CM | POA: Diagnosis not present

## 2023-05-07 DIAGNOSIS — E282 Polycystic ovarian syndrome: Secondary | ICD-10-CM

## 2023-05-07 DIAGNOSIS — N926 Irregular menstruation, unspecified: Secondary | ICD-10-CM | POA: Diagnosis not present

## 2023-05-07 DIAGNOSIS — E66811 Obesity, class 1: Secondary | ICD-10-CM

## 2023-05-07 MED ORDER — ZEPBOUND 7.5 MG/0.5ML ~~LOC~~ SOAJ: 7.5000 mg | SUBCUTANEOUS | 0 refills | Status: DC

## 2023-05-07 MED ORDER — ZEPBOUND 5 MG/0.5ML ~~LOC~~ SOAJ: 5.0000 mg | SUBCUTANEOUS | 0 refills | Status: DC

## 2023-05-07 MED ORDER — MEDROXYPROGESTERONE ACETATE 10 MG PO TABS: 10.0000 mg | ORAL_TABLET | Freq: Every day | ORAL | 2 refills | Status: AC

## 2023-05-07 MED ORDER — ZEPBOUND 12.5 MG/0.5ML ~~LOC~~ SOAJ: 12.5000 mg | SUBCUTANEOUS | 0 refills | Status: DC

## 2023-05-07 MED ORDER — TIRZEPATIDE-WEIGHT MANAGEMENT 2.5 MG/0.5ML ~~LOC~~ SOAJ: 2.5000 mg | SUBCUTANEOUS | 0 refills | Status: DC

## 2023-05-07 MED ORDER — ZEPBOUND 10 MG/0.5ML ~~LOC~~ SOAJ: 10.0000 mg | SUBCUTANEOUS | 0 refills | Status: DC

## 2023-05-10 ENCOUNTER — Telehealth: Payer: Self-pay

## 2023-05-15 ENCOUNTER — Encounter: Payer: Self-pay | Admitting: Obstetrics and Gynecology

## 2023-05-21 NOTE — Telephone Encounter (Signed)
 Prior Authorization has been initated.

## 2023-05-24 ENCOUNTER — Telehealth: Payer: Self-pay

## 2023-05-26 ENCOUNTER — Ambulatory Visit: Admitting: Certified Nurse Midwife

## 2023-05-27 ENCOUNTER — Encounter: Payer: Self-pay | Admitting: Certified Nurse Midwife

## 2023-05-27 NOTE — Telephone Encounter (Signed)
 Prior Authorization for Zepbound  was denied. Appeal was started. Takes about 30 business days for approval or denial. [id number], call reference # [id number].

## 2023-09-15 ENCOUNTER — Ambulatory Visit: Admitting: Family Medicine

## 2023-11-11 NOTE — Patient Instructions (Incomplete)

## 2023-11-11 NOTE — Progress Notes (Deleted)
 GYNECOLOGY ANNUAL PREVENTATIVE CARE ENCOUNTER NOTE  History:     Gina Green is a 23 y.o. G2P0010 female here for a routine annual gynecologic exam.  Current complaints: ***.   Denies abnormal vaginal bleeding, discharge, pelvic pain, problems with intercourse or other gynecologic concerns.     Social Relationship: Living: Work: Exercise: Smoke/Alcohol/drug use:  Gynecologic History No LMP recorded. (Menstrual status: Other). Contraception: {method:5051} Last Pap: ***. Results were: {norm/abn:16337} with negative HPV Last mammogram: ***. Results were: {norm/abn:16337}  Obstetric History OB History  Gravida Para Term Preterm AB Living  1    1   SAB IAB Ectopic Multiple Live Births  1        # Outcome Date GA Lbr Len/2nd Weight Sex Type Anes PTL Lv  1 SAB 04/2018            Past Medical History:  Diagnosis Date   Amenorrhea    Anxiety    Anxiety state 05/31/2015   Bipolar disorder (HCC)    Depression    Fibromyalgia    Generalized anxiety disorder 05/15/2014   Hearing loss    Insomnia 05/15/2014   Migraine without aura and without status migrainosus, not intractable 05/15/2014   Moderate headache 05/31/2015   Nystagmus    Palpitations    Panic attack 05/15/2014   PCOS (polycystic ovarian syndrome)    POTS (postural orthostatic tachycardia syndrome) 10/19/2016   Pre-syncope    Seizure-like activity (HCC) 05/31/2015   Tension headache 05/15/2014    Past Surgical History:  Procedure Laterality Date   NO PAST SURGERIES      Current Outpatient Medications on File Prior to Visit  Medication Sig Dispense Refill   ALPRAZolam (XANAX) 0.25 MG tablet Take 0.25 mg by mouth 2 (two) times daily.     ARIPIPRAZOLE PO Take by mouth.     escitalopram (LEXAPRO) 10 MG tablet Take 10 mg by mouth at bedtime.     medroxyPROGESTERone  (PROVERA ) 10 MG tablet Take 1 tablet (10 mg total) by mouth daily. Use for ten days 10 tablet 2   tirzepatide  (ZEPBOUND ) 10  MG/0.5ML Pen Inject 10 mg into the skin once a week. 2 mL 0   tirzepatide  (ZEPBOUND ) 12.5 MG/0.5ML Pen Inject 12.5 mg into the skin once a week. 2 mL 0   tirzepatide  (ZEPBOUND ) 2.5 MG/0.5ML Pen Inject 2.5 mg into the skin once a week. 2 mL 0   tirzepatide  (ZEPBOUND ) 5 MG/0.5ML Pen Inject 5 mg into the skin once a week. 2 mL 0   tirzepatide  (ZEPBOUND ) 7.5 MG/0.5ML Pen Inject 7.5 mg into the skin once a week. 2 mL 0   topiramate  (TOPAMAX ) 50 MG tablet Take 1 tablet (50 mg total) by mouth daily. Take 1/2 tablet daily, then increase to 1/2 tablet BID if symptoms persist 60 tablet 3   Vitamin D , Ergocalciferol , (DRISDOL ) 1.25 MG (50000 UNIT) CAPS capsule TAKE 1 CAPSULE BY MOUTH ONCE EVERY 7 DAYS 8 capsule 0   No current facility-administered medications on file prior to visit.    Allergies  Allergen Reactions   Hpv 9-Valent Recomb Vaccine Hives   Other Other (See Comments)    Seasonal allergies cause itching, water eyes and nosebleeds.    Social History:  reports that she has never smoked. She has never used smokeless tobacco. She reports that she does not drink alcohol and does not use drugs.  Family History  Problem Relation Age of Onset   Rheum arthritis Mother  High Cholesterol Mother    Thyroid  disease Mother    Depression Mother    Anxiety disorder Mother    Migraines Mother    Kidney disease Mother    Anxiety disorder Father    High Cholesterol Maternal Grandmother    Thyroid  disease Maternal Grandmother    Lung cancer Maternal Grandmother    Cancer Maternal Grandfather        MOUTH   Cirrhosis Maternal Grandfather    Cancer Paternal Grandmother        SKIN   Heart disease Other        maternal great grandparents, both had MI & strokes    The following portions of the patient's history were reviewed and updated as appropriate: allergies, current medications, past family history, past medical history, past social history, past surgical history and problem list.  Review  of Systems Pertinent items noted in HPI and remainder of comprehensive ROS otherwise negative.  Physical Exam:  There were no vitals taken for this visit. CONSTITUTIONAL: Well-developed, well-nourished female in no acute distress.  HENT:  Normocephalic, atraumatic, External right and left ear normal. Oropharynx is clear and moist EYES: Conjunctivae and EOM are normal. Pupils are equal, round, and reactive to light. No scleral icterus.  NECK: Normal range of motion, supple, no masses.  Normal thyroid .  SKIN: Skin is warm and dry. No rash noted. Not diaphoretic. No erythema. No pallor. MUSCULOSKELETAL: Normal range of motion. No tenderness.  No cyanosis, clubbing, or edema.  2+ distal pulses. NEUROLOGIC: Alert and oriented to person, place, and time. Normal reflexes, muscle tone coordination.  PSYCHIATRIC: Normal mood and affect. Normal behavior. Normal judgment and thought content. CARDIOVASCULAR: Normal heart rate noted, regular rhythm RESPIRATORY: Clear to auscultation bilaterally. Effort and breath sounds normal, no problems with respiration noted. BREASTS: Symmetric in size. No masses, tenderness, skin changes, nipple drainage, or lymphadenopathy bilaterally.  ABDOMEN: Soft, no distention noted.  No tenderness, rebound or guarding.  PELVIC: Normal appearing external genitalia and urethral meatus; normal appearing vaginal mucosa and cervix.  No abnormal discharge noted.  Pap smear obtained.  Normal uterine size, no other palpable masses, no uterine or adnexal tenderness.  .   Assessment and Plan:    There are no diagnoses linked to this encounter. Will follow up results of pap smear and manage accordingly. Pap: Mammogram : Labs: Refills: Referral: Routine preventative health maintenance measures emphasized. Please refer to After Visit Summary for other counseling recommendations.      Zelda Hummer, CNM [facility]  [facility],  [facility]

## 2023-11-12 ENCOUNTER — Ambulatory Visit: Admitting: Certified Nurse Midwife

## 2023-11-12 DIAGNOSIS — Z124 Encounter for screening for malignant neoplasm of cervix: Secondary | ICD-10-CM

## 2023-11-12 DIAGNOSIS — Z113 Encounter for screening for infections with a predominantly sexual mode of transmission: Secondary | ICD-10-CM

## 2023-11-12 DIAGNOSIS — Z01419 Encounter for gynecological examination (general) (routine) without abnormal findings: Secondary | ICD-10-CM

## 2024-02-02 NOTE — Patient Instructions (Signed)
 Preventing Unhealthy Weight Gain, Adult  Staying at a healthy weight is important to your overall health. When fat builds up in your body, you may become overweight or obese. Being overweight or obese increases your risk of developing various health problems.  Unhealthy weight gain is often the result of making unhealthy food choices or not getting enough exercise. You can make changes to your lifestyle to prevent obesity and stay as healthy as possible.  How can unhealthy weight gain affect me?  Being overweight or obese can cause you to develop joint or bone problems, which can make it hard for you to stay active or do activities you enjoy. Being overweight also puts stress on your heart and lungs and can lead to health problems such as:  Diabetes.  Heart disease.  Some types of cancer.  Stroke.  Eating healthy, staying active, and having healthy habits can help to prevent unhealthy weight gain and lower your risk for health problems. These lifestyle changes will also help you manage stress and emotions, improve your self-esteem, and connect with friends and family.  What can increase my risk?  In addition to certain eating and lifestyle choices, some other factors that may make you more likely to have unhealthy weight gain include:  Having a family history of obesity.  Living in an area with limited access to:  [place], recreation centers, or sidewalks.  Healthy food choices, such as grocery stores and farmers' markets.  What actions can I take to prevent unhealthy weight gain?  Nutrition    Eat only as much as your body needs. To do this:  Pay attention to signs that you are hungry or full. Stop eating as soon as you feel full.  If you feel hungry, try drinking water first before eating. Drink enough water so your urine is pale yellow.  Eat smaller portions. Pay attention to portion sizes when eating out.  Look at serving sizes on food labels. Most foods contain more than one serving per container.  Eat the  recommended number of calories for your gender and activity level. For most active people, a daily total of 2,000 calories is appropriate. If you are trying to lose weight or are not very active, you may need to eat fewer calories. Talk with your health care provider or a dietitian about how many calories you need each day.  Choose healthy foods, such as:  Fruits and vegetables. At each meal, try to fill at least half of your plate with fruits and vegetables.  Whole grains, such as whole-wheat bread, brown rice, and quinoa.  Lean meats, such as chicken or fish.  Other healthy proteins, such as beans, eggs, or tofu.  Healthy fats, such as nuts, seeds, fatty fish, and olive oil.  Low-fat or fat-free dairy products.  Check food labels, and avoid food and drinks that:  Are high in calories.  Have added sugar.  Are high in sodium.  Have saturated fats or trans fats.  Cook foods in healthier ways, such as by baking, broiling, or grilling.  Make a meal plan for the week, and shop with a grocery list to help you stay on track with your purchases. Try to avoid going to the grocery store when you are hungry.  When grocery shopping, try to shop around the outside of the store first, where the fresh foods are. Doing this helps you avoid prepackaged foods, which can be high in sugar, salt (sodium), and fat.  Lifestyle    Exercise  for 30 or more minutes on 5 or more days each week. Exercising may include brisk walking, yard work, biking, running, swimming, and team sports like basketball and soccer. Ask your health care provider which exercises are safe for you.  Do activities that strengthen the muscles, such as lifting weights or using resistance bands, on 2 or more days a week.  Do not use any products that contain nicotine or tobacco. These products include cigarettes, chewing tobacco, and vaping devices, such as e-cigarettes. If you need help quitting, ask your health care provider.  If you drink alcohol:  Limit how much you  have to:  0-1 drink a day for women who are not pregnant.  0-2 drinks a day for men.  Know how much alcohol is in a drink. In the [place] one drink equals one 12 oz bottle of beer (355 mL), one 5 oz glass of wine (148 mL), or one 1 oz glass of hard liquor (44 mL).  Try to get 7-9 hours of sleep each night.  Other changes  Keep a food and activity journal to keep track of:  What you ate and how many calories you had. Remember to count the calories in sauces, dressings, and side dishes.  Whether you were active, and what exercises you did.  Your calorie, weight, and activity goals.  Check your weight regularly. Track any changes. If you notice that you have gained weight, make changes to your diet or activity routine.  Avoid taking weight-loss medicines or supplements. Talk to your health care provider before starting any new medicine or supplement.  Talk to your health care provider before trying any new diet or exercise plan.  Where to find more information  Talk with your health care provider or a dietitian about healthy eating and healthy lifestyle choices. You may also find information from:  [place] Department of Agriculture, MyPlate: https://ball-collins.biz/  American Heart Association: www.heart.org  Centers for Disease Control and Prevention: FootballExhibition.com.br  Summary  Eating healthy, staying active, and having healthy habits can help to prevent unhealthy weight gain and lower your risk for health problems such as heart disease, diabetes, some types of cancer, and stroke.  Being overweight or obese can cause you to develop joint or bone problems, which can make it hard for you to stay active or do activities you enjoy.  You can prevent unhealthy weight gain by eating a healthy diet, exercising regularly, not smoking, limiting alcohol, and getting enough sleep.  Talk with your health care provider or a dietitian for guidance about healthy eating and healthy lifestyle choices.  This information is not intended to replace  advice given to you by your health care provider. Make sure you discuss any questions you have with your health care provider.  Document Revised: 08/09/2020 Document Reviewed: 08/09/2020  Elsevier Patient Education  2024 [employer].

## 2024-02-02 NOTE — Progress Notes (Signed)
"  °  °  Subjective:  Gina Green is a 24 y.o. G1P0010 at Unknown being seen today for weight loss management- initial visit.   Patient reports General ROS: negative and reports previous weight loss attempts: has used phentermine  and topamax  last year under the care of Dr. Connell  Onset was gradual over the past year after stopping program with Dr. Connell   Associated symptoms include: fatigue, depression, anxiety, change in clothing fit and menstrual changes. Previous/Current treatment includes: small frequent feedings, nutritional supplement, and appetite stimulant. Pt states she did lose weight with this medication but had a plateau. She was going to start Glp1 but was not able to get medication coverage.   Pertinent medical history includes: none  Risk factors include: work schedule  The patient has a surgical history nq:wnwz Pertinent social history includes: occasional alcohol use  Lab evaluation: pt will return next week for labs. CMP, TSH, Lipid, Hem A1C Past treatment has included: small frequent feedings, appetite stimulant, exercise management   The following portions of the patient's history were reviewed and updated as appropriate: allergies, current medications, past family history, past medical history, past social history, past surgical history and problem list.   Objective:   Vitals:   02/03/24 0925  BP: 112/72  Pulse: 83  Weight: 196 lb 6.4 oz (89.1 kg)  Height: 5' 2 (1.575 m)    General:  Alert, oriented and cooperative. Patient is in no acute distress.  PE: Well groomed female in no current distress,   Mental Status: Normal mood and affect. Normal behavior. Normal judgment and thought content.   Current BMI: Body mass index is 35.92 kg/m. Waist : 44.8 in Goal wt 150 lbs  Assessment and Plan:  Obesity  There are no diagnoses linked to this encounter.  Plan: low carb, High protein diet RX for adipex 37.5 mg daily and B12 1000mcg.ml monthly, to start now  with first injection given at today's visit. Reviewed side-effects common to both medications and expected outcomes. Increase daily water intake to at least 8 bottle a day, every day.  Goal is to reduse weight by 5% by end of three months, and will re-evaluate then.  RTC in 4 weeks for Nurse visit to check weight & BP, and get next B12 injections.    Please refer to After Visit Summary for other counseling recommendations.    Sebastian Sham, CNM      Consider the Low Glycemic Index Diet and 6 smaller meals daily .  This boosts your metabolism and regulates your sugars:   Use the protein bar by Atkins because they have lots of fiber in them  Find the low carb flatbreads, tortillas and pita breads for sandwiches:  Joseph's makes a pita bread and a flat bread , available at [facility] and [employer]; Toufayah makes a low carb flatbread available at [employer] and [employer] that is 9 net carbs and 100 cal Mission makes a low carb whole wheat tortilla available at [employer] most grocery stores with 6 net carbs and 210 cal  Greek yogurt can still have a lot of carbs .  Dannon Light N fit has 80 cal and 8 carbs   "

## 2024-02-03 ENCOUNTER — Ambulatory Visit: Admitting: Certified Nurse Midwife

## 2024-02-03 ENCOUNTER — Encounter: Payer: Self-pay | Admitting: Certified Nurse Midwife

## 2024-02-03 VITALS — BP 112/72 | HR 83 | Ht 62.0 in | Wt 196.4 lb

## 2024-02-03 DIAGNOSIS — Z6835 Body mass index (BMI) 35.0-35.9, adult: Secondary | ICD-10-CM | POA: Diagnosis not present

## 2024-02-03 DIAGNOSIS — E66812 Obesity, class 2: Secondary | ICD-10-CM

## 2024-02-03 DIAGNOSIS — E669 Obesity, unspecified: Secondary | ICD-10-CM

## 2024-02-03 DIAGNOSIS — Z7689 Persons encountering health services in other specified circumstances: Secondary | ICD-10-CM

## 2024-02-03 DIAGNOSIS — Z713 Dietary counseling and surveillance: Secondary | ICD-10-CM

## 2024-02-03 MED ORDER — CYANOCOBALAMIN 1000 MCG/ML IJ SOLN
1000.0000 ug | Freq: Once | INTRAMUSCULAR | Status: AC
  Administered 2024-02-03: 1000 ug via INTRAMUSCULAR

## 2024-02-03 MED ORDER — PHENTERMINE HCL 37.5 MG PO TABS: 37.5000 mg | ORAL_TABLET | Freq: Every day | ORAL | 0 refills | Status: AC

## 2024-02-16 ENCOUNTER — Telehealth: Payer: Self-pay | Admitting: Certified Nurse Midwife

## 2024-02-16 ENCOUNTER — Other Ambulatory Visit

## 2024-02-16 DIAGNOSIS — Z7689 Persons encountering health services in other specified circumstances: Secondary | ICD-10-CM

## 2024-02-16 DIAGNOSIS — E66812 Obesity, class 2: Secondary | ICD-10-CM

## 2024-02-16 NOTE — Telephone Encounter (Signed)
 Reached out to pt to reschedule lab appt that was scheduled on 02/16/2024 at 10:00.  Left message for pt to call back to reschedule.

## 2024-02-17 ENCOUNTER — Encounter: Payer: Self-pay | Admitting: Certified Nurse Midwife

## 2024-02-17 NOTE — Telephone Encounter (Signed)
 Reached out to pt (2x) to reschedule lab appt that scheduled on 02/16/2024 at 10:00.  Left message for pt to call back to reschedule.  Will send a MyChart letter to pt.

## 2024-03-01 NOTE — Progress Notes (Unsigned)
"  ° ° °  °  SUBJECTIVE:  24 y.o. here for follow-up weight loss visit, previously seen 4 weeks ago. Denies any concerns and feels like medication is .........  OBJECTIVE:  LMP 01/04/2024 (Exact Date)   There is no height or weight on file to calculate BMI. Patient appears well.  ASSESSMENT:  Obesity- responding well to weight loss plan  PLAN:  To continue with current medications. B12 1000mcg/ml injection given  RTC in 4 weeks as planned  Zelda Hummer, CNM "

## 2024-03-02 ENCOUNTER — Ambulatory Visit: Admitting: Certified Nurse Midwife

## 2024-03-02 DIAGNOSIS — E6609 Other obesity due to excess calories: Secondary | ICD-10-CM

## 2024-03-02 DIAGNOSIS — Z7689 Persons encountering health services in other specified circumstances: Secondary | ICD-10-CM
